# Patient Record
Sex: Female | Born: 2000 | Race: Black or African American | Hispanic: No | Marital: Single | Smoking: Never smoker
Health system: Southern US, Community
[De-identification: ages and names within clinical notes are randomized; demographics above are authoritative.]

## PROBLEM LIST (undated history)

## (undated) DIAGNOSIS — E669 Obesity, unspecified: Secondary | ICD-10-CM

## (undated) DIAGNOSIS — IMO0001 Reserved for inherently not codable concepts without codable children: Secondary | ICD-10-CM

## (undated) DIAGNOSIS — Z87442 Personal history of urinary calculi: Secondary | ICD-10-CM

## (undated) DIAGNOSIS — M79671 Pain in right foot: Secondary | ICD-10-CM

## (undated) DIAGNOSIS — F419 Anxiety disorder, unspecified: Secondary | ICD-10-CM

## (undated) DIAGNOSIS — J45909 Unspecified asthma, uncomplicated: Secondary | ICD-10-CM

## (undated) DIAGNOSIS — F32A Depression, unspecified: Secondary | ICD-10-CM

## (undated) DIAGNOSIS — J309 Allergic rhinitis, unspecified: Secondary | ICD-10-CM

## (undated) DIAGNOSIS — R030 Elevated blood-pressure reading, without diagnosis of hypertension: Secondary | ICD-10-CM

## (undated) HISTORY — PX: WISDOM TOOTH EXTRACTION: SHX21

## (undated) HISTORY — DX: Obesity, unspecified: E66.9

## (undated) HISTORY — DX: Reserved for inherently not codable concepts without codable children: IMO0001

## (undated) HISTORY — DX: Allergic rhinitis, unspecified: J30.9

## (undated) HISTORY — DX: Elevated blood-pressure reading, without diagnosis of hypertension: R03.0

---
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
## 2022-02-02 ENCOUNTER — Ambulatory Visit
Admission: EM | Admit: 2022-02-02 | Discharge: 2022-02-02 | Disposition: A | Discharge status: Home / Self Care | Payer: Medicaid Other | Attending: Physician Assistant | Admitting: Physician Assistant

## 2022-02-02 DIAGNOSIS — R6889 Other general symptoms and signs: Secondary | ICD-10-CM

## 2022-02-02 DIAGNOSIS — Z1152 Encounter for screening for COVID-19: Secondary | ICD-10-CM

## 2022-02-02 DIAGNOSIS — J069 Acute upper respiratory infection, unspecified: Secondary | ICD-10-CM

## 2022-02-02 LAB — POCT INFLUENZA A/B
Influenza A, POC: NEGATIVE
Influenza B, POC: NEGATIVE

## 2022-02-02 MED ORDER — OSELTAMIVIR PHOSPHATE 75 MG PO CAPS: 75.0000 mg | ORAL_CAPSULE | Freq: Two times a day (BID) | ORAL | 0 refills | Status: DC

## 2022-02-02 NOTE — ED Triage Notes (Signed)
Pt presents to uc with co of hoarseness, cough,body aches and pains, and sinus pain for 2-3 days. Pt reports no otc medications

## 2022-02-02 NOTE — ED Provider Notes (Signed)
[facility]    CSN: [id number] Arrival date & time: 02/02/22  1315      History   Chief Complaint Chief Complaint  Patient presents with   Hoarse   Cough   Facial Pain    HPI Brittany Mcmahon is a 22 y.o. female.   Patient here today for evaluation of hoarseness, cough, body aches and sinus congestion that she has had for the last 2 to 3 days.  She reports that she has not taken any over-the-counter medication for symptoms.  She reports that as time progresses her symptoms worsen.  She has not had any vomiting or diarrhea.  The history is provided by the patient.  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 Authorizing Provider  oseltamivir (TAMIFLU) 75 MG capsule Take 1 capsule (75 mg total) by mouth every 12 (twelve) hours. 02/02/22  Yes Tomi Bamberger, PA-C  albuterol (VENTOLIN HFA) 108 (90 Base) MCG/ACT inhaler Inhale 1-2 puffs into the lungs every 6 (six) hours as needed for wheezing or shortness of  breath. Patient not taking: Reported on 11/05/2021 04/06/21   [name], MD  metroNIDAZOLE (FLAGYL) 500 MG tablet Take 1 tablet (500 mg total) by mouth 2 (two) times daily. Patient not taking: Reported on 11/05/2021 09/14/21   Corlis Hove, NP  mupirocin ointment (BACTROBAN) 2 % Apply topically 2 (two) times daily. Patient not taking: Reported on 11/05/2021 08/06/21   [name], MD  ondansetron (ZOFRAN-ODT) 4 MG disintegrating tablet Take 1 tablet (4 mg total) by mouth every 8 (eight) hours as needed for nausea or vomiting. 11/15/21   Tomi Bamberger, PA-C  Vitamin D, Ergocalciferol, (DRISDOL) 1.25 MG (50000 UNIT) CAPS capsule Take 1 capsule (50,000 Units total) by mouth every 7 (seven) days. 04/29/21   Georges Mouse, NP    Family History Family History  Problem Relation Age of Onset   Hypertension Mother    Obesity Mother    Hypertension Father    Hypertension Maternal Grandmother    Asthma Maternal Uncle     Social History Social History   Tobacco Use   Smoking status: Never    Passive exposure: Yes   Smokeless tobacco: Never  Vaping Use   Vaping Use: Never used  Substance Use Topics   Alcohol use: No   Drug use: No     Allergies   Patient has no known allergies.   Review of Systems Review of Systems  Constitutional:  Negative for chills and fever.  HENT:  Positive for congestion and sinus pressure. Negative for ear pain and sore throat.   Eyes:  Negative for discharge and redness.  Respiratory:  Positive for cough. Negative for shortness of breath and wheezing.   Gastrointestinal:  Negative for abdominal pain, diarrhea, nausea and vomiting.  Musculoskeletal:  Positive for myalgias.     Physical Exam Triage Vital Signs ED Triage Vitals  Enc Vitals Group     BP 02/02/22 1524 131/79     Pulse Rate 02/02/22 1524 (!) 120     Resp 02/02/22 1524 19     Temp 02/02/22 1524 99 F (37.2 C)     Temp src --      SpO2 02/02/22 1524 98 %     Weight --       Height --      Head Circumference --      Peak Flow --      Pain Score 02/02/22 1523 4     Pain Loc --      Pain Edu? --      Excl. in [place]? --    No data found.  Updated Vital Signs BP 131/79   Pulse (!) 120   Temp 99 F (37.2 C)   Resp 19   LMP 12/31/2021 (Approximate)   SpO2 98%      Physical Exam Vitals and nursing note reviewed.  Constitutional:      General: She is in acute distress (appears to not feel well).     Appearance: She is not ill-appearing.  HENT:     Head: Normocephalic and atraumatic.     Nose: Congestion present.     Mouth/Throat:     Mouth: Mucous membranes are moist.     Pharynx: Posterior oropharyngeal erythema present. No oropharyngeal exudate.  Eyes:     Conjunctiva/sclera: Conjunctivae normal.  Cardiovascular:     Rate and Rhythm: Normal rate and regular rhythm.     Heart sounds: Normal heart sounds. No murmur heard. Pulmonary:     Effort: Pulmonary effort is normal. No respiratory distress.     Breath sounds: Normal breath sounds. No wheezing, rhonchi or rales.  Skin:    General: Skin is warm and dry.  Neurological:     Mental Status: She is alert.  Psychiatric:        Mood and Affect: Mood normal.        Thought Content: Thought content normal.      UC Treatments / Results  Labs (all labs ordered are listed, but only abnormal results are displayed) Labs Reviewed  SARS CORONAVIRUS 2 (TAT 6-24 HRS)  POCT INFLUENZA A/B    EKG   Radiology No results found.  Procedures Procedures (including critical care time)  Medications Ordered in UC Medications - No data to display  Initial Impression / Assessment and Plan / UC Course  I have reviewed the triage vital signs and the nursing notes.  Pertinent labs & imaging results that were available during my care of the patient were reviewed by me and considered in my medical decision making (see chart for details).    Suspect viral etiology of symptoms.  Flu test negative in office  however  given flulike symptoms offered Tamiflu for treatment which patient would like to proceed with.  COVID screening also ordered.  Recommended symptomatic treatment otherwise, increase fluids and rest with follow-up if symptoms fail to improve or worsen.  Final Clinical Impressions(s) / UC Diagnoses   Final diagnoses:  Acute upper respiratory infection  Encounter for screening for COVID-19  Flu-like symptoms   Discharge Instructions   None    ED Prescriptions     Medication Sig Dispense Auth. Provider   oseltamivir (TAMIFLU) 75 MG capsule Take 1 capsule (75 mg total) by mouth every 12 (twelve) hours. 10 capsule Francene Finders, PA-C      PDMP not reviewed this encounter.   Francene Finders, PA-C 02/02/22 [phone]

## 2022-02-03 LAB — SARS CORONAVIRUS 2 (TAT 6-24 HRS): SARS Coronavirus 2: NEGATIVE

## 2022-02-10 ENCOUNTER — Ambulatory Visit (INDEPENDENT_AMBULATORY_CARE_PROVIDER_SITE_OTHER): Payer: Medicaid Other | Admitting: Pediatrics

## 2022-02-10 ENCOUNTER — Other Ambulatory Visit (HOSPITAL_COMMUNITY)
Admission: RE | Admit: 2022-02-10 | Discharge: 2022-02-10 | Disposition: A | Discharge status: Home / Self Care | Payer: Medicaid Other | Source: Ambulatory Visit | Attending: Pediatrics | Admitting: Pediatrics

## 2022-02-10 ENCOUNTER — Encounter: Payer: Self-pay | Admitting: Pediatrics

## 2022-02-10 VITALS — Wt 268.8 lb

## 2022-02-10 DIAGNOSIS — Z3202 Encounter for pregnancy test, result negative: Secondary | ICD-10-CM

## 2022-02-10 DIAGNOSIS — J302 Other seasonal allergic rhinitis: Secondary | ICD-10-CM | POA: Diagnosis not present

## 2022-02-10 DIAGNOSIS — Z113 Encounter for screening for infections with a predominantly sexual mode of transmission: Secondary | ICD-10-CM | POA: Diagnosis present

## 2022-02-10 LAB — POCT URINE PREGNANCY: Preg Test, Ur: NEGATIVE

## 2022-02-10 MED ORDER — FLUTICASONE PROPIONATE 50 MCG/ACT NA SUSP: 1.0000 | Freq: Every day | NASAL | 3 refills | Status: DC

## 2022-02-10 NOTE — Progress Notes (Signed)
    Subjective:    Brittany Mcmahon is a 22 y.o. female  presenting to the clinic today for recheck of STIs. She was treated for gonorrhea 3 months back with ceftriaxone & also treated for BV with metronidazole. Pt is with a new partner for the past month & has been using condoms regularly. She has an IUD in place but wanted urine pregnancy test as she was sick with flu symptoms & ws worried about her nausea. No dysuria, no dyspareunia. Seen in the ED last week & treated with Tamiflu for flu like symptoms. Still has nasal congestion & would like refill of Flonase.  Review of Systems  Constitutional:  Negative for activity change, appetite change and fever.  Respiratory:  Negative for cough, shortness of breath and wheezing.   Gastrointestinal:  Negative for abdominal pain, diarrhea, nausea and vomiting.  Genitourinary:  Negative for dyspareunia, dysuria and vaginal discharge.  Skin:  Negative for rash.  Neurological:  Negative for headaches.  Psychiatric/Behavioral:  Negative for sleep disturbance.        Objective:   Physical Exam Vitals and nursing note reviewed.  Constitutional:      General: She is not in acute distress. HENT:     Head: Normocephalic and atraumatic.     Right Ear: External ear normal.     Left Ear: External ear normal.     Nose:     Comments: Boggy turbinates Eyes:     General:        Right eye: No discharge.        Left eye: No discharge.     Conjunctiva/sclera: Conjunctivae normal.  Cardiovascular:     Rate and Rhythm: Normal rate and regular rhythm.     Heart sounds: Normal heart sounds.  Pulmonary:     Effort: No respiratory distress.     Breath sounds: No wheezing or rales.  Musculoskeletal:     Cervical back: Normal range of motion.  Skin:    General: Skin is warm and dry.     Findings: No rash.    .Wt 268 lb 12.8 oz (121.9 kg)   LMP 12/31/2021 (Approximate)   BMI 46.17 kg/m       Assessment & Plan:  1. Seasonal allergies Turbinate  hypertrophy Refilled Flonase Use albuterol as needed  2. Screening examination for venereal disease Will send MyChart message with lab results - POCT urine pregnancy- negative - Urine cytology ancillary only - WET PREP BY MOLECULAR PROBE    Return if symptoms worsen or fail to improve.  Claudean Kinds, MD 02/10/2022 3:10 PM

## 2022-02-10 NOTE — Patient Instructions (Addendum)
Please check your MyChart message for STI screenings. Please continue to use condoms regularly to prevent STIs.  Please use albuterol as needed for cough/wheezing & use Flonase as needed for your nasal turbinate hypertrophy.

## 2022-02-11 LAB — WET PREP BY MOLECULAR PROBE
Candida species: NOT DETECTED
Gardnerella vaginalis: NOT DETECTED
MICRO NUMBER:: 14413222
SPECIMEN QUALITY:: ADEQUATE
Trichomonas vaginosis: NOT DETECTED

## 2022-02-11 LAB — URINE CYTOLOGY ANCILLARY ONLY
Chlamydia: NEGATIVE
Comment: NEGATIVE
Comment: NORMAL
Neisseria Gonorrhea: NEGATIVE

## 2022-10-02 ENCOUNTER — Emergency Department (HOSPITAL_COMMUNITY): Payer: Medicaid Other

## 2022-10-02 ENCOUNTER — Other Ambulatory Visit: Payer: Self-pay

## 2022-10-02 ENCOUNTER — Emergency Department (HOSPITAL_COMMUNITY)
Admission: EM | Admit: 2022-10-02 | Discharge: 2022-10-02 | Disposition: A | Discharge status: Home / Self Care | Payer: Medicaid Other | Source: Home / Self Care | Attending: Emergency Medicine | Admitting: Emergency Medicine

## 2022-10-02 ENCOUNTER — Encounter (HOSPITAL_COMMUNITY): Payer: Self-pay | Admitting: *Deleted

## 2022-10-02 DIAGNOSIS — T8332XA Displacement of intrauterine contraceptive device, initial encounter: Secondary | ICD-10-CM | POA: Insufficient documentation

## 2022-10-02 DIAGNOSIS — E876 Hypokalemia: Secondary | ICD-10-CM | POA: Insufficient documentation

## 2022-10-02 DIAGNOSIS — D72829 Elevated white blood cell count, unspecified: Secondary | ICD-10-CM | POA: Insufficient documentation

## 2022-10-02 DIAGNOSIS — N3000 Acute cystitis without hematuria: Secondary | ICD-10-CM | POA: Insufficient documentation

## 2022-10-02 DIAGNOSIS — N200 Calculus of kidney: Secondary | ICD-10-CM | POA: Insufficient documentation

## 2022-10-02 DIAGNOSIS — R1030 Lower abdominal pain, unspecified: Secondary | ICD-10-CM | POA: Diagnosis present

## 2022-10-02 LAB — URINALYSIS, ROUTINE W REFLEX MICROSCOPIC
Bilirubin Urine: NEGATIVE
Glucose, UA: NEGATIVE mg/dL
Ketones, ur: NEGATIVE mg/dL
Nitrite: NEGATIVE
Protein, ur: 30 mg/dL — AB
Specific Gravity, Urine: 1.013 (ref 1.005–1.030)
pH: 6 (ref 5.0–8.0)

## 2022-10-02 LAB — COMPREHENSIVE METABOLIC PANEL
ALT: 20 U/L (ref 0–44)
AST: 22 U/L (ref 15–41)
Albumin: 3.7 g/dL (ref 3.5–5.0)
Alkaline Phosphatase: 66 U/L (ref 38–126)
Anion gap: 13 (ref 5–15)
BUN: 9 mg/dL (ref 6–20)
CO2: 26 mmol/L (ref 22–32)
Calcium: 9.3 mg/dL (ref 8.9–10.3)
Chloride: 98 mmol/L (ref 98–111)
Creatinine, Ser: 0.69 mg/dL (ref 0.44–1.00)
GFR, Estimated: >60 mL/min (ref >60)
Glucose, Bld: 87 mg/dL (ref 70–99)
Potassium: 3.4 mmol/L — ABNORMAL LOW (ref 3.5–5.1)
Sodium: 137 mmol/L (ref 135–145)
Total Bilirubin: 0.3 mg/dL (ref 0.3–1.2)
Total Protein: 8.3 g/dL — ABNORMAL HIGH (ref 6.5–8.1)

## 2022-10-02 LAB — CBC
HCT: 36.6 % (ref 36.0–46.0)
Hemoglobin: 11.7 g/dL — ABNORMAL LOW (ref 12.0–15.0)
MCH: 27.1 pg (ref 26.0–34.0)
MCHC: 32 g/dL (ref 30.0–36.0)
MCV: 84.7 fL (ref 80.0–100.0)
Platelets: 426 10*3/uL — ABNORMAL HIGH (ref 150–400)
RBC: 4.32 MIL/uL (ref 3.87–5.11)
RDW: 13.3 % (ref 11.5–15.5)
WBC: 14.7 10*3/uL — ABNORMAL HIGH (ref 4.0–10.5)
nRBC: 0 % (ref 0.0–0.2)

## 2022-10-02 LAB — HCG, SERUM, QUALITATIVE: Preg, Serum: NEGATIVE

## 2022-10-02 LAB — LIPASE, BLOOD: Lipase: 25 U/L (ref 11–51)

## 2022-10-02 MED ORDER — IOHEXOL 350 MG/ML SOLN
75.0000 mL | Freq: Once | INTRAVENOUS | Status: AC | PRN
  Administered 2022-10-02: 75 mL via INTRAVENOUS

## 2022-10-02 MED ORDER — SODIUM CHLORIDE 0.9 % IV SOLN
1.0000 g | Freq: Once | INTRAVENOUS | Status: AC
  Administered 2022-10-02: 1 g via INTRAVENOUS
  Filled 2022-10-02: qty 10

## 2022-10-02 MED ORDER — DOCUSATE SODIUM 100 MG PO CAPS: 100.0000 mg | ORAL_CAPSULE | Freq: Two times a day (BID) | ORAL | 0 refills | Status: DC

## 2022-10-02 MED ORDER — ONDANSETRON HCL 4 MG/2ML IJ SOLN
4.0000 mg | Freq: Once | INTRAMUSCULAR | Status: AC
  Administered 2022-10-02: 4 mg via INTRAVENOUS
  Filled 2022-10-02: qty 2

## 2022-10-02 MED ORDER — CEPHALEXIN 500 MG PO CAPS: 500.0000 mg | ORAL_CAPSULE | Freq: Two times a day (BID) | ORAL | 0 refills | Status: DC

## 2022-10-02 MED ORDER — MORPHINE SULFATE (PF) 4 MG/ML IV SOLN
4.0000 mg | Freq: Once | INTRAVENOUS | Status: AC
  Administered 2022-10-02: 4 mg via INTRAVENOUS
  Filled 2022-10-02: qty 1

## 2022-10-02 NOTE — ED Provider Notes (Signed)
[facility] Emergency Department Provider Note MRN:  [id number]  Arrival date & time: 10/02/22     Chief Complaint   Abdominal Pain   History of Present Illness   Brittany Mcmahon is a 22 y.o. year-old female presents to the ED with chief complaint of lower abdominal pain for the past 3 days.  She reports associated nausea.  She states that she is just finishing her period.  She denies any dysuria or hematuria.  Denies abnormal urinary symptoms. She denies any fevers or chills. She states that the symptoms have been worsening.  History provided by patient.   Review of Systems  Pertinent positive and negative review of systems noted in HPI.    Physical Exam   Vitals:   10/02/22 0300 10/02/22 0445  BP: 125/83 (!) 142/96  Pulse: 92 89  Resp:  16  Temp:  98.7 F (37.1 C)  SpO2: 100% 100%    CONSTITUTIONAL:  non toxic-appearing, NAD NEURO:  Alert and oriented x 3, CN 3-12 grossly intact EYES:  eyes equal and reactive ENT/NECK:  Supple, no stridor  CARDIO:  tachycardic, regular rhythm, appears well-perfused  PULM:  No respiratory distress, CTAB GI/GU:  non-distended, RLQ TTP MSK/SPINE:  No gross deformities, no edema, moves all extremities  SKIN:  no rash, atraumatic   *Additional and/or pertinent findings included in MDM below  Diagnostic and Interventional Summary    EKG Interpretation Date/Time:    Ventricular Rate:    PR Interval:    QRS Duration:    QT Interval:    QTC Calculation:   R Axis:      Text Interpretation:         Labs Reviewed  COMPREHENSIVE METABOLIC PANEL - Abnormal; Notable for the following components:      Result Value   Potassium 3.4 (*)    Total Protein 8.3 (*)    All other components within normal limits  CBC - Abnormal; Notable for the following components:   WBC 14.7 (*)    Hemoglobin 11.7 (*)    Platelets 426 (*)    All other components within normal limits  URINALYSIS, ROUTINE W REFLEX MICROSCOPIC -  Abnormal; Notable for the following components:   APPearance HAZY (*)    Hgb urine dipstick MODERATE (*)    Protein, ur 30 (*)    Leukocytes,Ua LARGE (*)    Bacteria, UA FEW (*)    All other components within normal limits  LIPASE, BLOOD  HCG, SERUM, QUALITATIVE    CT ABDOMEN PELVIS W CONTRAST  Final Result      Medications  morphine (PF) 4 MG/ML injection 4 mg (4 mg Intravenous Given 10/02/22 0332)  ondansetron (ZOFRAN) injection 4 mg (4 mg Intravenous Given 10/02/22 0331)  iohexol (OMNIPAQUE) 350 MG/ML injection 75 mL (75 mLs Intravenous Contrast Given 10/02/22 0356)  cefTRIAXone (ROCEPHIN) 1 g in sodium chloride 0.9 % 100 mL IVPB (0 g Intravenous Stopped 10/02/22 7829)     Procedures  /  Critical Care Procedures  ED Course and Medical Decision Making  I have reviewed the triage vital signs, the nursing notes, and pertinent available records from the EMR.  Social Determinants Affecting Complexity of Care: Patient has no clinically significant social determinants affecting this chief complaint..   ED Course:    Medical Decision Making Patient here with lower abdominal pain for the past 3 days.  She has had some nausea, but denies recent vomiting.    She denies any fevers.  UA  looks concerning for UTI. She does have some RLQ TTP on exam.  I'll check a CT to rule out appy.    Amount and/or Complexity of Data Reviewed Labs: ordered.    Details: UA worrisome for infection, will treat with keflex Leukocytosis to 14.7, thought 2/2 UTI Preg test is negative Mild hypokalemia of 3.4 Radiology: ordered.    Details: No evidence of appy on CT, questionable malpositioned IUD, I discussed this with the patient.  She will follow-up with OBGYN.    Questionable renal stone, but not thought to be the cause of her pain  Risk OTC drugs. Prescription drug management.         Consultants: No consultations were needed in caring for this patient.   Treatment and Plan: I  considered admission due to patient's initial presentation, but after considering the examination and diagnostic results, patient will not require admission and can be discharged with outpatient follow-up.    Final Clinical Impressions(s) / ED Diagnoses     ICD-10-CM   1. Acute cystitis without hematuria  N30.00     2. Malpositioned intrauterine device (IUD), initial encounter  T83.32XA     3. Renal stone  N20.0       ED Discharge Orders          Ordered    cephALEXin (KEFLEX) 500 MG capsule  2 times daily        10/02/22 0517    docusate sodium (COLACE) 100 MG capsule  Every 12 hours        10/02/22 0517              Discharge Instructions Discussed with and Provided to Patient:     Discharge Instructions      Your symptoms and tests are consistent with bladder infection.  Take antibiotics as directed.  You might have some constipation as well.    If symptoms change or worsen, return to the ER.       Roxy Horseman, PA-C 10/02/22 0530    Palumbo, April, MD 10/02/22 [phone]

## 2022-10-02 NOTE — ED Notes (Signed)
Patient transported to CT 

## 2022-10-02 NOTE — Discharge Instructions (Addendum)
Your symptoms and tests are consistent with bladder infection.  Take antibiotics as directed.  You might have some constipation as well.    If symptoms change or worsen, return to the ER.

## 2022-10-02 NOTE — ED Triage Notes (Signed)
The pt is c/o abd pain for 3 days with nausea lmp last week

## 2022-10-04 ENCOUNTER — Encounter (HOSPITAL_COMMUNITY): Payer: Self-pay

## 2022-10-04 ENCOUNTER — Emergency Department (HOSPITAL_COMMUNITY)
Admission: EM | Admit: 2022-10-04 | Discharge: 2022-10-05 | Disposition: A | Discharge status: Home / Self Care | Payer: Medicaid Other | Attending: Emergency Medicine | Admitting: Emergency Medicine

## 2022-10-04 ENCOUNTER — Other Ambulatory Visit: Payer: Self-pay

## 2022-10-04 DIAGNOSIS — R102 Pelvic and perineal pain: Secondary | ICD-10-CM | POA: Diagnosis present

## 2022-10-04 LAB — URINALYSIS, ROUTINE W REFLEX MICROSCOPIC
Bacteria, UA: NONE SEEN
Bilirubin Urine: NEGATIVE
Glucose, UA: NEGATIVE mg/dL
Ketones, ur: NEGATIVE mg/dL
Leukocytes,Ua: NEGATIVE
Nitrite: NEGATIVE
Protein, ur: NEGATIVE mg/dL
Specific Gravity, Urine: 1.013 (ref 1.005–1.030)
pH: 6 (ref 5.0–8.0)

## 2022-10-04 LAB — CBC WITH DIFFERENTIAL/PLATELET
Abs Immature Granulocytes: 0.06 10*3/uL (ref 0.00–0.07)
Basophils Absolute: 0.1 10*3/uL (ref 0.0–0.1)
Basophils Relative: 1 %
Eosinophils Absolute: 0.7 10*3/uL — ABNORMAL HIGH (ref 0.0–0.5)
Eosinophils Relative: 6 %
HCT: 39 % (ref 36.0–46.0)
Hemoglobin: 12.3 g/dL (ref 12.0–15.0)
Immature Granulocytes: 1 %
Lymphocytes Relative: 37 %
Lymphs Abs: 4.1 10*3/uL — ABNORMAL HIGH (ref 0.7–4.0)
MCH: 27.2 pg (ref 26.0–34.0)
MCHC: 31.5 g/dL (ref 30.0–36.0)
MCV: 86.1 fL (ref 80.0–100.0)
Monocytes Absolute: 0.5 10*3/uL (ref 0.1–1.0)
Monocytes Relative: 5 %
Neutro Abs: 5.6 10*3/uL (ref 1.7–7.7)
Neutrophils Relative %: 50 %
Platelets: 451 10*3/uL — ABNORMAL HIGH (ref 150–400)
RBC: 4.53 MIL/uL (ref 3.87–5.11)
RDW: 13.5 % (ref 11.5–15.5)
WBC: 11 10*3/uL — ABNORMAL HIGH (ref 4.0–10.5)
nRBC: 0 % (ref 0.0–0.2)

## 2022-10-04 LAB — COMPREHENSIVE METABOLIC PANEL
ALT: 19 U/L (ref 0–44)
AST: 19 U/L (ref 15–41)
Albumin: 3.4 g/dL — ABNORMAL LOW (ref 3.5–5.0)
Alkaline Phosphatase: 62 U/L (ref 38–126)
Anion gap: 12 (ref 5–15)
BUN: 8 mg/dL (ref 6–20)
CO2: 27 mmol/L (ref 22–32)
Calcium: 9.4 mg/dL (ref 8.9–10.3)
Chloride: 99 mmol/L (ref 98–111)
Creatinine, Ser: 0.64 mg/dL (ref 0.44–1.00)
GFR, Estimated: >60 mL/min (ref >60)
Glucose, Bld: 95 mg/dL (ref 70–99)
Potassium: 3.5 mmol/L (ref 3.5–5.1)
Sodium: 138 mmol/L (ref 135–145)
Total Bilirubin: 0.5 mg/dL (ref 0.3–1.2)
Total Protein: 8 g/dL (ref 6.5–8.1)

## 2022-10-04 LAB — HCG, SERUM, QUALITATIVE: Preg, Serum: NEGATIVE

## 2022-10-04 NOTE — ED Triage Notes (Signed)
Pt arrives with c/o left sided flank pain that started today. Pt recently diagnosed with a UTI and kidney stones. Per pt, she had an episode of n/v today while at work. Pt worried antibiotics are not working. Pt denies fevers.

## 2022-10-05 ENCOUNTER — Emergency Department (HOSPITAL_COMMUNITY): Payer: Medicaid Other

## 2022-10-05 MED ORDER — ONDANSETRON HCL 4 MG PO TABS: 4.0000 mg | ORAL_TABLET | Freq: Three times a day (TID) | ORAL | 0 refills | Status: DC | PRN

## 2022-10-05 NOTE — ED Provider Notes (Signed)
[place] [facility] Provider Note   CSN: [id number] Arrival date & time: 10/04/22  2204     History  Chief Complaint  Patient presents with   Flank Pain    Brittany Mcmahon is a 22 y.o. female.  Patient with past medical history significant for IUD, obesity presents to the emergency department complaining of pelvic pain.  Patient was evaluated last week at the emergency department for similar pains and a CT abdomen pelvis was performed which showed evidence that her IUD may have the left bar penetrating the myometrium with no associated inflammation or free fluid.  There was also questionable right lower pole nephrolithiasis with no convincing obstructive uropathy or renal inflammation.  UA suggested UTI and the patient was started on Keflex.  She states she is currently taken 3 days of Keflex.  Today she took her Keflex dose at approximately 11 AM and upon arriving at work at approximately 4 PM began to feel nauseated and vomited twice.  She called the nurse line at her OB/GYN who recommended she come to the emergency department for evaluation.  She denies chest pain, shortness of breath, urinary symptoms.  Endorses intermittent pelvic pain, nausea, vomiting.   Flank Pain       Home Medications Prior to Admission medications   Medication Sig Start Date End Date Taking? Authorizing Provider  ondansetron (ZOFRAN) 4 MG tablet Take 1 tablet (4 mg total) by mouth every 8 (eight) hours as needed for nausea or vomiting. 10/05/22  Yes Darrick Grinder, PA-C  albuterol (VENTOLIN HFA) 108 (90 Base) MCG/ACT inhaler Inhale 1-2 puffs into the lungs every 6 (six) hours as needed for wheezing or shortness of breath. Patient not taking: Reported on 11/05/2021 04/06/21   [name], MD  cephALEXin (KEFLEX) 500 MG capsule Take 1 capsule (500 mg total) by mouth 2 (two) times daily. 10/02/22   Roxy Horseman, PA-C  docusate sodium (COLACE) 100 MG capsule Take 1 capsule  (100 mg total) by mouth every 12 (twelve) hours. 10/02/22   Roxy Horseman, PA-C  fluticasone (FLONASE) 50 MCG/ACT nasal spray Place 1 spray into both nostrils daily. 02/10/22   [name], MD  oseltamivir (TAMIFLU) 75 MG capsule Take 1 capsule (75 mg total) by mouth every 12 (twelve) hours. 02/02/22   Tomi Bamberger, PA-C  Vitamin D, Ergocalciferol, (DRISDOL) 1.25 MG (50000 UNIT) CAPS capsule Take 1 capsule (50,000 Units total) by mouth every 7 (seven) days. Patient not taking: Reported on 02/10/2022 04/29/21   Georges Mouse, NP      Allergies    Patient has no known allergies.    Review of Systems   Review of Systems  Genitourinary:  Positive for flank pain.    Physical Exam Updated Vital Signs BP (!) 173/109 (BP Location: Right Arm)   Pulse (!) 105   Temp 98.9 F (37.2 C) (Oral)   Resp (!) 22   Wt 121.6 kg   LMP 09/25/2022   SpO2 100%   BMI 47.47 kg/m  Physical Exam Vitals and nursing note reviewed.  Constitutional:      General: She is not in acute distress.    Appearance: She is well-developed.  HENT:     Head: Normocephalic and atraumatic.  Eyes:     Conjunctiva/sclera: Conjunctivae normal.  Cardiovascular:     Rate and Rhythm: Normal rate and regular rhythm.     Heart sounds: No murmur heard. Pulmonary:     Effort: Pulmonary  effort is normal. No respiratory distress.     Breath sounds: Normal breath sounds.  Abdominal:     Palpations: Abdomen is soft.     Tenderness: There is abdominal tenderness (Mild tenderness to palpation of the suprapubic area).  Genitourinary:    Comments: Deferred Musculoskeletal:        General: No swelling.     Cervical back: Neck supple.  Skin:    General: Skin is warm and dry.     Capillary Refill: Capillary refill takes less than 2 seconds.  Neurological:     Mental Status: She is alert.  Psychiatric:        Mood and Affect: Mood normal.     ED Results / Procedures / Treatments   Labs (all labs ordered are listed,  but only abnormal results are displayed) Labs Reviewed  CBC WITH DIFFERENTIAL/PLATELET - Abnormal; Notable for the following components:      Result Value   WBC 11.0 (*)    Platelets 451 (*)    Lymphs Abs 4.1 (*)    Eosinophils Absolute 0.7 (*)    All other components within normal limits  COMPREHENSIVE METABOLIC PANEL - Abnormal; Notable for the following components:   Albumin 3.4 (*)    All other components within normal limits  URINALYSIS, ROUTINE W REFLEX MICROSCOPIC - Abnormal; Notable for the following components:   Color, Urine STRAW (*)    Hgb urine dipstick SMALL (*)    All other components within normal limits  HCG, SERUM, QUALITATIVE    EKG None  Radiology US PELVIC COMPLETE W TRANSVAGINAL AND TORSION R/O  Result Date: 10/05/2022 CLINICAL DATA:  Pelvic pain, left flank pain EXAM: TRANSABDOMINAL AND TRANSVAGINAL ULTRASOUND OF PELVIS DOPPLER ULTRASOUND OF OVARIES TECHNIQUE: Both transabdominal and transvaginal ultrasound examinations of the pelvis were performed. Transabdominal technique was performed for global imaging of the pelvis including uterus, ovaries, adnexal regions, and pelvic cul-de-sac. It was necessary to proceed with endovaginal exam following the transabdominal exam to visualize the uterus, endometrium, ovaries and adnexa. Color and duplex Doppler ultrasound was utilized to evaluate blood flow to the ovaries. COMPARISON:  CT 10/02/2022 FINDINGS: Uterus Measurements: 5.0 x 2.7 x 3.7 cm = volume: 26 mL. No fibroids or other mass visualized. Endometrium Thickness: Normal thickness, 2 mm. IUD in place. The lower uterine segment limb of the IUD appears to penetrate into the posterior myometrium of the lower uterine segment. No focal abnormality visualized. Right ovary Measurements: 1.6 x 1.4 x 1.6 cm = volume: 2 mL. Normal appearance/no adnexal mass. Left ovary Measurements: 3.1 x 1.4 x 3.0 cm = volume: 7 mL. Normal appearance/no adnexal mass. Pulsed Doppler evaluation of  both ovaries demonstrates normal low-resistance arterial and venous waveforms. Other findings Small amount of free fluid in the pelvis. IMPRESSION: No acute findings. IUD noted within the endometrium. The lower bar/limb of the IUD appears to penetrate into the myometrium posteriorly within the lower uterine segment. Electronically Signed   By: Charlett Nose M.D.   On: 10/05/2022 02:57    Procedures Procedures    Medications Ordered in ED Medications - No data to display  ED Course/ Medical Decision Making/ A&P                                 Medical Decision Making Amount and/or Complexity of Data Reviewed Labs: ordered. Radiology: ordered.   This patient presents to the ED for concern of pelvic  pain, nausea, vomiting, this involves an extensive number of treatment options, and is a complaint that carries with it a high risk of complications and morbidity.  The differential diagnosis includes pain due to IUD placement, all reaction to Keflex, TOA, ectopic pregnancy, PID, others   Co morbidities that complicate the patient evaluation  Obesity   Additional history obtained:   External records from outside source obtained and reviewed including CT results from last week   Lab Tests:  I Ordered, and personally interpreted labs.  The pertinent results include: UA with small hemoglobin but otherwise unremarkable with no bacteria, nitrite negative, leukocyte negative; WBC 11,000, improved from 14,700 last week; negative pregnancy test   Imaging Studies ordered:  I ordered imaging studies including pelvic ultrasound I independently visualized and interpreted imaging which showed  No acute findings.    IUD noted within the endometrium. The lower bar/limb of the IUD  appears to penetrate into the myometrium posteriorly within the  lower uterine segment.   I agree with the radiologist interpretation   Social Determinants of Health:  Patient has Medicaid for her primary health  insurance type   Test / Admission - Considered:  Reassuring pelvic ultrasound.  No significant acute findings on ultrasound, no significant abnormal findings on lab work today.  Question patient may be having a reaction to the Keflex the timing of the nausea does not directly correlate with the Keflex usage.  Plan to prescribe Zofran for nausea and have patient continue with plan to follow-up with GYN for further evaluation of her IUD placement.  I do not see indication today for further CT imaging as pain is very intermittent in nature and not easily reproducible on exam.  Plan to discharge home with return precautions.         Final Clinical Impression(s) / ED Diagnoses Final diagnoses:  Pelvic pain    Rx / DC Orders ED Discharge Orders          Ordered    ondansetron (ZOFRAN) 4 MG tablet  Every 8 hours PRN        10/05/22 0314              Darrick Grinder, PA-C 10/05/22 0317    Dione Booze, MD 10/05/22 [phone]

## 2022-10-05 NOTE — ED Notes (Signed)
Patient transported to Ultrasound 

## 2022-10-05 NOTE — Discharge Instructions (Signed)
You were evaluated today for pelvic pain, nausea, vomiting.  Your workup was reassuring.  Please follow-up with GYN to discuss the positioning of your IUD.  If you develop any life-threatening symptoms or is worsening abdominal pain or intractable nausea and vomiting, please return to the emergency department.

## 2023-01-14 ENCOUNTER — Encounter (HOSPITAL_COMMUNITY): Payer: Self-pay

## 2023-01-14 ENCOUNTER — Ambulatory Visit (HOSPITAL_COMMUNITY)
Admission: RE | Admit: 2023-01-14 | Discharge: 2023-01-14 | Disposition: A | Discharge status: Home / Self Care | Payer: Medicaid Other | Source: Ambulatory Visit | Attending: Family Medicine | Admitting: Family Medicine

## 2023-01-14 VITALS — BP 137/89 | HR 98 | Temp 98.9°F | Resp 16 | Ht 63.0 in | Wt 121.6 lb

## 2023-01-14 DIAGNOSIS — Z202 Contact with and (suspected) exposure to infections with a predominantly sexual mode of transmission: Secondary | ICD-10-CM | POA: Diagnosis present

## 2023-01-14 DIAGNOSIS — Z113 Encounter for screening for infections with a predominantly sexual mode of transmission: Secondary | ICD-10-CM | POA: Insufficient documentation

## 2023-01-14 DIAGNOSIS — H66001 Acute suppurative otitis media without spontaneous rupture of ear drum, right ear: Secondary | ICD-10-CM | POA: Insufficient documentation

## 2023-01-14 LAB — RPR: RPR Ser Ql: NONREACTIVE

## 2023-01-14 LAB — HIV ANTIBODY (ROUTINE TESTING W REFLEX): HIV Screen 4th Generation wRfx: NONREACTIVE

## 2023-01-14 MED ORDER — AMOXICILLIN 875 MG PO TABS: 875.0000 mg | ORAL_TABLET | Freq: Two times a day (BID) | ORAL | 0 refills | Status: AC

## 2023-01-14 NOTE — Discharge Instructions (Signed)
You were diagnosed with an ear infection today.  I have sent out an antibiotic to take twice/day x 10 days.  Your swab and blood work will be resulted tomorrow.  If anything is positive that needs to be treated you will be notified, or may call here as it is the weekend.

## 2023-01-14 NOTE — ED Provider Notes (Signed)
[facility]    CSN: [id number] Arrival date & time: 01/14/23  7829      History   Chief Complaint Chief Complaint  Patient presents with   Appointment   Otalgia   Exposure to STD    HPI Brittany Mcmahon is a 22 y.o. female.    Otalgia Exposure to STD  Patient is here for right ear pain/muffled sounds x 4 days.  No runny nose, congestion that is new for her.  No fevers/chills.   Possible exposure to chlamydia.  No vaginal symptoms noted.  No burning/pain/odor/discharge.        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 Authorizing Provider  albuterol (VENTOLIN HFA) 108 (90 Base) MCG/ACT inhaler Inhale 1-2 puffs into the lungs every 6 (six) hours as needed for wheezing or shortness of breath. Patient not taking: Reported on 11/05/2021 04/06/21   [name], MD  levonorgestrel (MIRENA) 20 MCG/DAY IUD 1 each by Intrauterine route once.    [provider]    Family History Family History  Problem Relation Age of Onset   Hypertension Mother    Obesity Mother    Hypertension Father    Hypertension Maternal Grandmother    Asthma Maternal Uncle     Social  History Social History   Tobacco Use   Smoking status: Never    Passive exposure: Yes   Smokeless tobacco: Never  Vaping Use   Vaping status: Never Used  Substance Use Topics   Alcohol use: No   Drug use: No     Allergies   Patient has no known allergies.   Review of Systems Review of Systems  Constitutional: Negative.   HENT:  Positive for ear pain.   Respiratory: Negative.    Cardiovascular: Negative.   Gastrointestinal: Negative.   Genitourinary: Negative.   Musculoskeletal: Negative.   Psychiatric/Behavioral: Negative.       Physical Exam Triage Vital Signs ED Triage Vitals  Encounter Vitals Group     BP 01/14/23 0949 137/89     Systolic BP Percentile --      Diastolic BP Percentile --      Pulse Rate 01/14/23 0949 98     Resp 01/14/23 0949 16     Temp 01/14/23 0949 98.9 F (37.2 C)     Temp Source 01/14/23 0949 Oral     SpO2 01/14/23 0949 95 %  Weight 01/14/23 0949 121 lb 9.6 oz (55.2 kg)     Height 01/14/23 0949 5\' 3"  (1.6 m)     Head Circumference --      Peak Flow --      Pain Score 01/14/23 0946 1     Pain Loc --      Pain Education --      Exclude from Growth Chart --    No data found.  Updated Vital Signs BP 137/89 (BP Location: Right Arm)   Pulse 98   Temp 98.9 F (37.2 C) (Oral)   Resp 16   Ht 5\' 3"  (1.6 m)   Wt 55.2 kg   SpO2 95%   BMI 21.54 kg/m   Visual Acuity Right Eye Distance:   Left Eye Distance:   Bilateral Distance:    Right Eye Near:   Left Eye Near:    Bilateral Near:     Physical Exam Constitutional:      Appearance: Normal appearance.  HENT:     Right Ear: A middle ear effusion is present. Tympanic membrane is erythematous and bulging.     Left Ear: Tympanic membrane normal.  Cardiovascular:     Rate and Rhythm: Normal rate and regular rhythm.  Pulmonary:     Effort: Pulmonary effort is normal.     Breath sounds: Normal breath sounds.  Musculoskeletal:     Cervical back: Normal range of motion and  neck supple.  Neurological:     General: No focal deficit present.     Mental Status: She is alert.  Psychiatric:        Mood and Affect: Mood normal.      UC Treatments / Results  Labs (all labs ordered are listed, but only abnormal results are displayed) Labs Reviewed  RPR  HIV ANTIBODY (ROUTINE TESTING W REFLEX)  CERVICOVAGINAL ANCILLARY ONLY    EKG   Radiology No results found.  Procedures Procedures (including critical care time)  Medications Ordered in UC Medications - No data to display  Initial Impression / Assessment and Plan / UC Course  I have reviewed the triage vital signs and the nursing notes.  Pertinent labs & imaging results that were available during my care of the patient were reviewed by me and considered in my medical decision making (see chart for details).   Final Clinical Impressions(s) / UC Diagnoses   Final diagnoses:  Possible exposure to STD  Non-recurrent acute suppurative otitis media of right ear without spontaneous rupture of tympanic membrane  Screening for STD (sexually transmitted disease)     Discharge Instructions      You were diagnosed with an ear infection today.  I have sent out an antibiotic to take twice/day x 10 days.  Your swab and blood work will be resulted tomorrow.  If anything is positive that needs to be treated you will be notified, or may call here as it is the weekend.     ED Prescriptions     Medication Sig Dispense Auth. Provider   amoxicillin (AMOXIL) 875 MG tablet Take 1 tablet (875 mg total) by mouth 2 (two) times daily for 10 days. 20 tablet Jannifer Franklin, MD      PDMP not reviewed this encounter.   Jannifer Franklin, MD 01/14/23 1003

## 2023-01-14 NOTE — ED Triage Notes (Addendum)
Right ear pain and muffled sound onset this past Tuesday night. Patient was listening to music on her way home, then when she got out the car she noticed sound on that side was muffled. No history of issues with the ear. No recent illness, no known sick exposure.   Patient also states she was informed by her partner that he tested positive for chlamydia. No current symptoms. Last interacted with this partner in November.

## 2023-01-17 ENCOUNTER — Telehealth (HOSPITAL_COMMUNITY): Payer: Self-pay

## 2023-01-17 LAB — CERVICOVAGINAL ANCILLARY ONLY
Bacterial Vaginitis (gardnerella): NEGATIVE
Candida Glabrata: NEGATIVE
Candida Vaginitis: POSITIVE — AB
Chlamydia: POSITIVE — AB
Comment: NEGATIVE
Comment: NEGATIVE
Comment: NEGATIVE
Comment: NEGATIVE
Comment: NEGATIVE
Comment: NORMAL
Neisseria Gonorrhea: NEGATIVE
Trichomonas: NEGATIVE

## 2023-01-17 MED ORDER — DOXYCYCLINE HYCLATE 100 MG PO CAPS: 100.0000 mg | ORAL_CAPSULE | Freq: Two times a day (BID) | ORAL | 0 refills | Status: DC

## 2023-01-17 MED ORDER — FLUCONAZOLE 150 MG PO TABS: 150.0000 mg | ORAL_TABLET | Freq: Once | ORAL | 0 refills | Status: AC

## 2023-01-17 NOTE — Telephone Encounter (Signed)
Per protocol, pt requires tx with Diflucan and Doxycycline. Attempted to reach patient x1. "Call could not be completed." Rx sent to pharmacy on file.

## 2023-06-23 ENCOUNTER — Other Ambulatory Visit: Payer: Self-pay

## 2023-06-23 ENCOUNTER — Encounter (HOSPITAL_COMMUNITY): Payer: Self-pay | Admitting: Emergency Medicine

## 2023-06-23 ENCOUNTER — Emergency Department (HOSPITAL_COMMUNITY)
Admission: EM | Admit: 2023-06-23 | Discharge: 2023-06-23 | Disposition: A | Discharge status: Home / Self Care | Attending: Emergency Medicine | Admitting: Emergency Medicine

## 2023-06-23 DIAGNOSIS — X102XXA Contact with fats and cooking oils, initial encounter: Secondary | ICD-10-CM | POA: Diagnosis not present

## 2023-06-23 DIAGNOSIS — T2020XA Burn of second degree of head, face, and neck, unspecified site, initial encounter: Secondary | ICD-10-CM | POA: Insufficient documentation

## 2023-06-23 DIAGNOSIS — J45909 Unspecified asthma, uncomplicated: Secondary | ICD-10-CM | POA: Diagnosis not present

## 2023-06-23 MED ORDER — NAPROXEN 500 MG PO TABS: 500.0000 mg | ORAL_TABLET | Freq: Two times a day (BID) | ORAL | 0 refills | Status: DC

## 2023-06-23 MED ORDER — KETOROLAC TROMETHAMINE 15 MG/ML IJ SOLN
15.0000 mg | Freq: Once | INTRAMUSCULAR | Status: AC
  Administered 2023-06-23: 15 mg via INTRAVENOUS
  Filled 2023-06-23: qty 1

## 2023-06-23 MED ORDER — MUPIROCIN CALCIUM 2 % EX CREA
TOPICAL_CREAM | Freq: Once | CUTANEOUS | Status: AC
  Filled 2023-06-23: qty 15
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
## 2023-08-30 ENCOUNTER — Encounter (HOSPITAL_COMMUNITY): Payer: Self-pay | Admitting: *Deleted

## 2023-08-30 ENCOUNTER — Emergency Department (HOSPITAL_COMMUNITY)
Admission: EM | Admit: 2023-08-30 | Discharge: 2023-08-30 | Disposition: A | Discharge status: Home / Self Care | Attending: Emergency Medicine | Admitting: Emergency Medicine

## 2023-08-30 ENCOUNTER — Other Ambulatory Visit: Payer: Self-pay

## 2023-08-30 ENCOUNTER — Emergency Department (HOSPITAL_COMMUNITY)

## 2023-08-30 DIAGNOSIS — N132 Hydronephrosis with renal and ureteral calculous obstruction: Secondary | ICD-10-CM | POA: Insufficient documentation

## 2023-08-30 DIAGNOSIS — R109 Unspecified abdominal pain: Secondary | ICD-10-CM | POA: Diagnosis present

## 2023-08-30 DIAGNOSIS — N23 Unspecified renal colic: Secondary | ICD-10-CM

## 2023-08-30 LAB — URINALYSIS, ROUTINE W REFLEX MICROSCOPIC
Bacteria, UA: NONE SEEN
Bilirubin Urine: NEGATIVE
Glucose, UA: NEGATIVE mg/dL
Ketones, ur: NEGATIVE mg/dL
Nitrite: NEGATIVE
Protein, ur: 30 mg/dL — AB
RBC / HPF: >50 RBC/hpf (ref 0–5)
Specific Gravity, Urine: 1.023 (ref 1.005–1.030)
pH: 7 (ref 5.0–8.0)

## 2023-08-30 LAB — COMPREHENSIVE METABOLIC PANEL WITH GFR
ALT: 12 U/L (ref 0–44)
AST: 20 U/L (ref 15–41)
Albumin: 3.8 g/dL (ref 3.5–5.0)
Alkaline Phosphatase: 74 U/L (ref 38–126)
Anion gap: 10 (ref 5–15)
BUN: 21 mg/dL — ABNORMAL HIGH (ref 6–20)
CO2: 25 mmol/L (ref 22–32)
Calcium: 9.7 mg/dL (ref 8.9–10.3)
Chloride: 105 mmol/L (ref 98–111)
Creatinine, Ser: 1.07 mg/dL — ABNORMAL HIGH (ref 0.44–1.00)
GFR, Estimated: >60 mL/min (ref >60)
Glucose, Bld: 99 mg/dL (ref 70–99)
Potassium: 3.9 mmol/L (ref 3.5–5.1)
Sodium: 140 mmol/L (ref 135–145)
Total Bilirubin: 0.4 mg/dL (ref 0.0–1.2)
Total Protein: 8.8 g/dL — ABNORMAL HIGH (ref 6.5–8.1)

## 2023-08-30 LAB — CBC WITH DIFFERENTIAL/PLATELET
Abs Immature Granulocytes: 0.13 K/uL — ABNORMAL HIGH (ref 0.00–0.07)
Basophils Absolute: 0.1 K/uL (ref 0.0–0.1)
Basophils Relative: 1 %
Eosinophils Absolute: 0.7 K/uL — ABNORMAL HIGH (ref 0.0–0.5)
Eosinophils Relative: 4 %
HCT: 44.9 % (ref 36.0–46.0)
Hemoglobin: 14 g/dL (ref 12.0–15.0)
Immature Granulocytes: 1 %
Lymphocytes Relative: 20 %
Lymphs Abs: 3.9 K/uL (ref 0.7–4.0)
MCH: 27 pg (ref 26.0–34.0)
MCHC: 31.2 g/dL (ref 30.0–36.0)
MCV: 86.7 fL (ref 80.0–100.0)
Monocytes Absolute: 1.3 K/uL — ABNORMAL HIGH (ref 0.1–1.0)
Monocytes Relative: 7 %
Neutro Abs: 12.8 K/uL — ABNORMAL HIGH (ref 1.7–7.7)
Neutrophils Relative %: 67 %
Platelets: 483 K/uL — ABNORMAL HIGH (ref 150–400)
RBC: 5.18 MIL/uL — ABNORMAL HIGH (ref 3.87–5.11)
RDW: 14.1 % (ref 11.5–15.5)
WBC: 18.9 K/uL — ABNORMAL HIGH (ref 4.0–10.5)
nRBC: 0 % (ref 0.0–0.2)

## 2023-08-30 LAB — HCG, SERUM, QUALITATIVE: Preg, Serum: NEGATIVE

## 2023-08-30 LAB — LIPASE, BLOOD: Lipase: 36 U/L (ref 11–51)

## 2023-08-30 IMAGING — DX DG WRIST COMPLETE 3+V*R*
4 series · 4 of 4 positions shown · non-contrast
Comparison: None Available.

CLINICAL DATA: Right wrist injury.  Pain along the ulnar aspect.

EXAM:
RIGHT WRIST - COMPLETE 3+ VIEW

[wrist pa (1 of 2)]
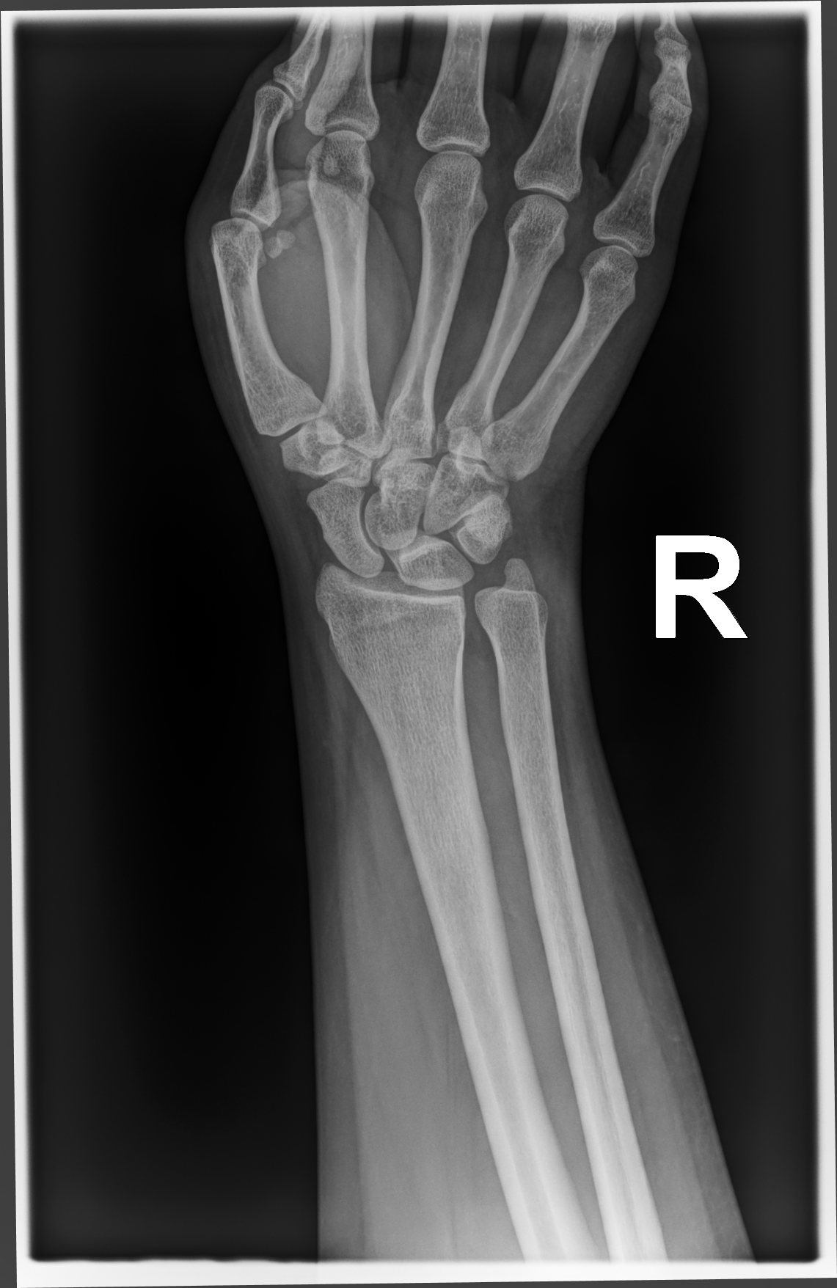

[wrist pa (2 of 2)]
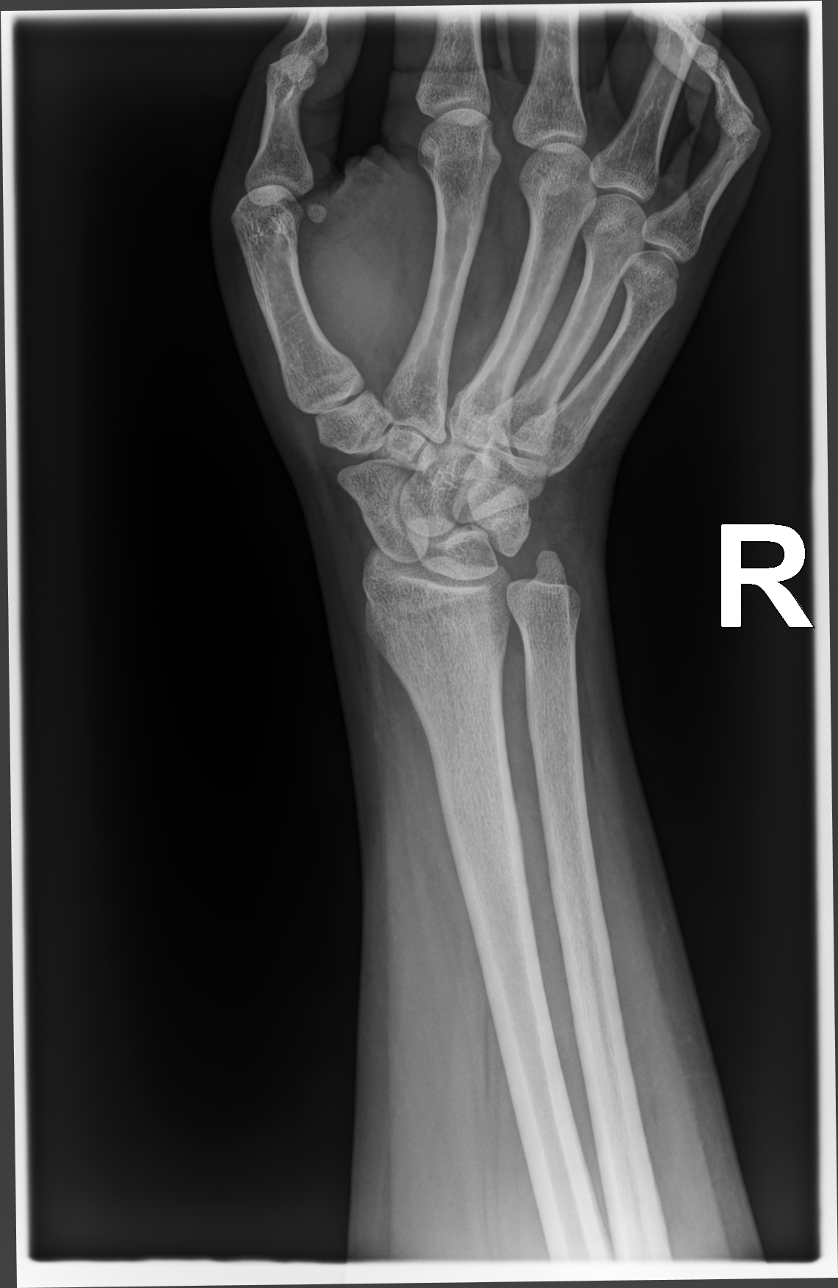

[wrist lat]
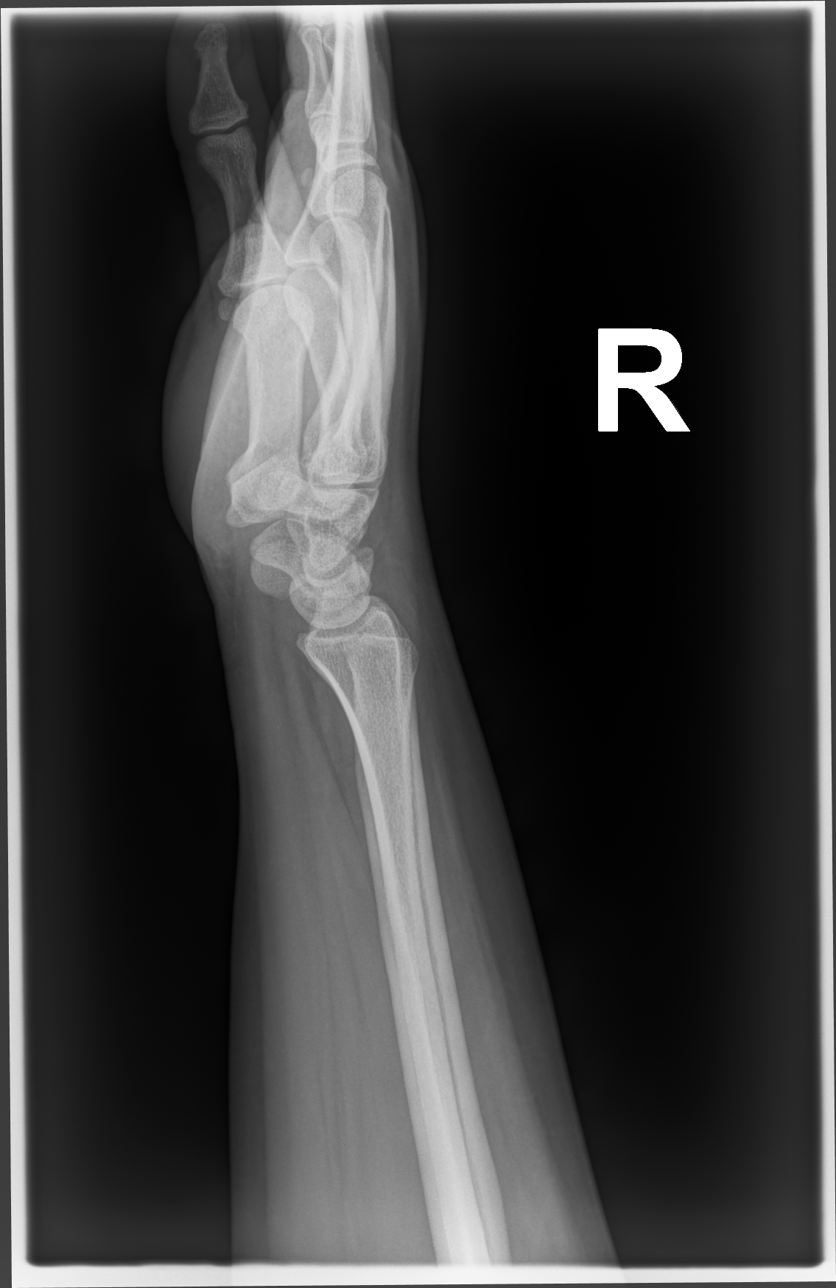

[scaphoid (os scaphoideum i) pa]
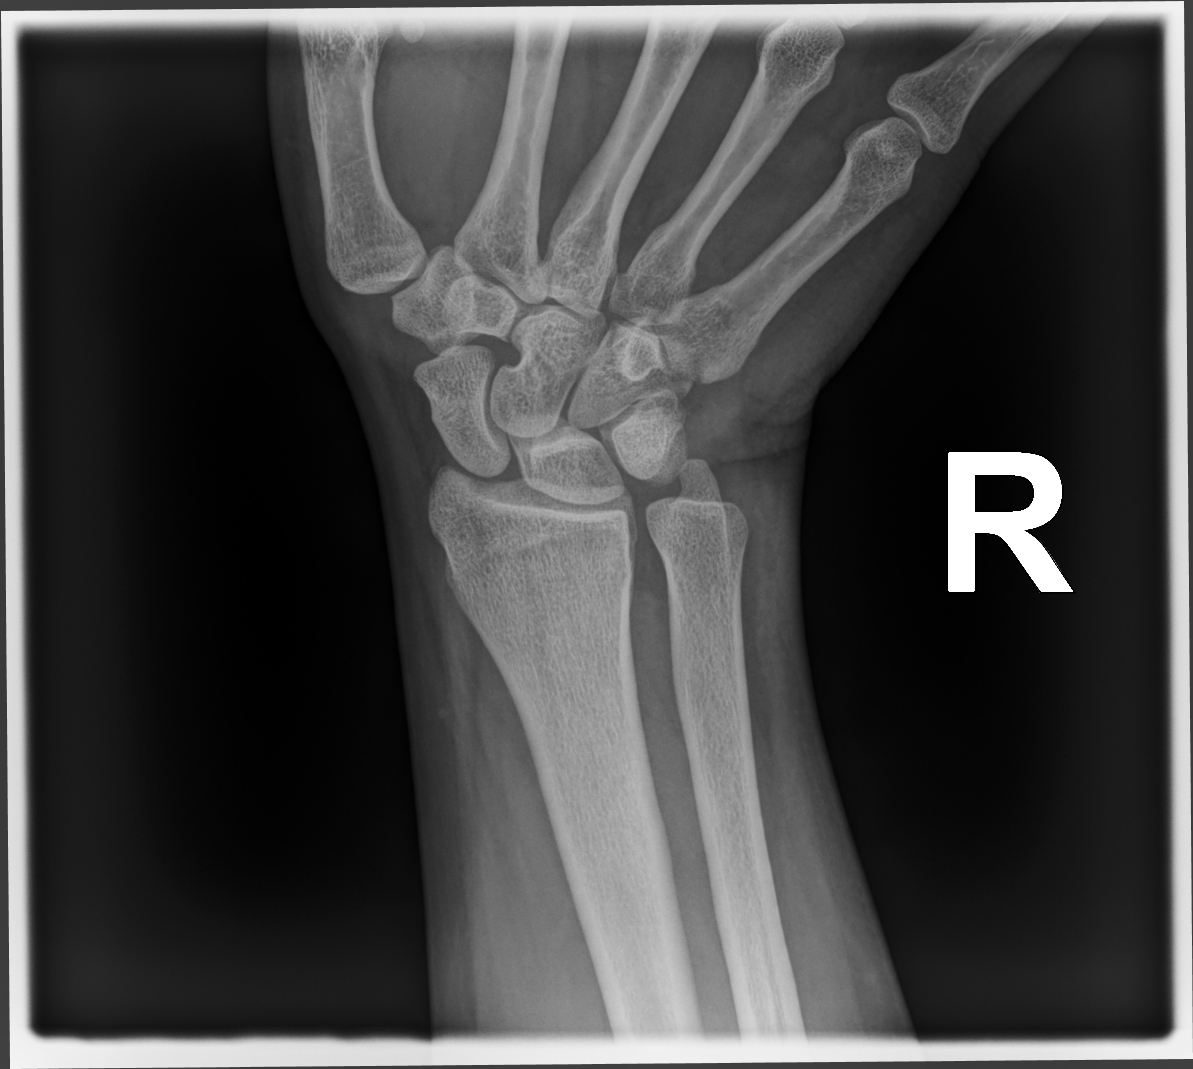

[4 of 4 positions shown; findings below may reference images not displayed]

FINDINGS: Neutral ulnar variance. Normal bone mineralization. Joint spaces are
preserved. No acute fracture is seen. No dislocation.
IMPRESSION: Normal right wrist radiographs.

## 2023-08-30 MED ORDER — HYDROMORPHONE HCL 1 MG/ML IJ SOLN
0.5000 mg | Freq: Once | INTRAMUSCULAR | Status: AC
  Administered 2023-08-30: 0.5 mg via INTRAVENOUS
  Filled 2023-08-30: qty 1

## 2023-08-30 MED ORDER — ONDANSETRON 4 MG PO TBDP: 4.0000 mg | ORAL_TABLET | Freq: Three times a day (TID) | ORAL | 0 refills | Status: DC | PRN

## 2023-08-30 MED ORDER — OXYCODONE HCL 5 MG PO TABS: 5.0000 mg | ORAL_TABLET | Freq: Four times a day (QID) | ORAL | 0 refills | Status: DC | PRN

## 2023-08-30 MED ORDER — NAPROXEN 500 MG PO TABS: 500.0000 mg | ORAL_TABLET | Freq: Two times a day (BID) | ORAL | 0 refills | Status: AC

## 2023-08-30 MED ORDER — KETOROLAC TROMETHAMINE 30 MG/ML IJ SOLN
30.0000 mg | Freq: Once | INTRAMUSCULAR | Status: AC
  Administered 2023-08-30: 30 mg via INTRAVENOUS
  Filled 2023-08-30: qty 1

## 2023-08-30 MED ORDER — NAPROXEN 500 MG PO TABS: 500.0000 mg | ORAL_TABLET | Freq: Two times a day (BID) | ORAL | 0 refills | Status: DC

## 2023-08-30 MED ORDER — OXYCODONE HCL 5 MG PO TABS
5.0000 mg | ORAL_TABLET | Freq: Once | ORAL | Status: AC
  Administered 2023-08-30: 5 mg via ORAL
  Filled 2023-08-30: qty 1

## 2023-08-30 MED ORDER — TAMSULOSIN HCL 0.4 MG PO CAPS: 0.4000 mg | ORAL_CAPSULE | Freq: Every day | ORAL | 0 refills | Status: DC

## 2023-08-30 MED ORDER — ONDANSETRON HCL 4 MG/2ML IJ SOLN
4.0000 mg | Freq: Once | INTRAMUSCULAR | Status: AC
Start: 2023-08-30 — End: 2023-08-30
  Administered 2023-08-30: 4 mg via INTRAVENOUS
  Filled 2023-08-30: qty 2

## 2023-08-30 MED ORDER — IOHEXOL 300 MG/ML  SOLN
100.0000 mL | Freq: Once | INTRAMUSCULAR | Status: AC | PRN
  Administered 2023-08-30: 100 mL via INTRAVENOUS

## 2023-08-30 NOTE — ED Notes (Signed)
Pt is back in room from CT

## 2023-08-30 NOTE — Discharge Instructions (Signed)
 Please read and follow all provided instructions.  Your diagnoses today include:  1. Ureteral colic     Tests performed today include: Urine test that showed blood in your urine and no infection CT scan which showed a 3 millimeter kidney stone on the right side Blood test that showed slightly weak kidney function Vital signs. See below for your results today.   Medications prescribed:  Oxycodone  - narcotic pain medication  DO NOT drive or perform any activities that require you to be awake and alert because this medicine can make you drowsy.   Zofran  (ondansetron ) - for nausea and vomiting  Flomax  (tamsulosin ) - relaxes smooth muscle to help kidney stones pass  Take any prescribed medications only as directed.  Home care instructions:  Follow any educational materials contained in this packet.  Please double your fluid intake for the next several days. Strain your urine and save any stones that may pass.   BE VERY CAREFUL not to take multiple medicines containing Tylenol  (also called acetaminophen ). Doing so can lead to an overdose which can damage your liver and cause liver failure and possibly death.   Follow-up instructions: Please follow-up with your urologist or the urologist referral (provided on front page) in the next 1 week for further evaluation of your symptoms.  Return instructions:  If you need to return to the Emergency Department, go to [facility] and not [facility]. The urologists are located at [facility] and can better care for you at this location.  Please return to the Emergency Department if you experience worsening symptoms.  Please return if you develop fever or uncontrolled pain or vomiting. Please return if you have any other emergent concerns.  Additional Information:  Your vital signs today were: BP (!) 129/93 (BP Location: Left Arm)   Pulse 74   Temp (!) 97.4 F (36.3 C) (Oral)   Resp 18   Wt 113.4 kg   SpO2 97%   BMI  44.29 kg/m  If your blood pressure (BP) was elevated above 135/85 this visit, please have this repeated by your doctor within one month. --------------

## 2023-08-30 NOTE — ED Notes (Signed)
Pt given sprite to drink for fluid challenge 

## 2023-08-30 NOTE — ED Triage Notes (Signed)
 BIB [facility] from home for RLQ pain, radiates to R flank. Endorses pain, NV. Denies diarrhea, fever, urinary or vaginal sx, or bleeding. No CVA TTP. EMS reports rebound TTP of RLQ. H/o kidney stones and IUD. VSS. No IV. CBG 100. VS: 108/66, HR 110, SPO2 98% RA.

## 2023-08-30 NOTE — ED Provider Notes (Signed)
 Received signout from previous provider pending p.o. challenge, reassessment.  See his note.  In short, patient presents emerged department for evaluation of right-sided abdominal pain that has been worsening with associated vomiting.  ED workup notable for negative hCG.  UA without obvious signs of infection.  Creatinine 1.07.  Leukocytosis of 18.9.  CT shows obstructing 3 mm right ureteral stone with mild hydronephrosis  Was provided Toradol , oxycodone  for pain and reports significant improvement of pain.  Passed p.o. challenge.  Ready and stable for DC.  Upon DC, patient request to change pharmacies to [facility].  I changed prescribe medications to this pharmacy and called previous pharmacy to ensure no narcotic medication was prescribed at previous pharmacy.  Discussed ED workup, disposition, return to ED precautions with patient who expresses understanding agrees with plan.  All questions answered to their satisfaction.  They are agreeable to plan.  Discharge instructions provided on paperwork   Minnie Tinnie BRAVO, PA 08/30/23 1648    Melvenia Motto, MD 08/30/23 2142

## 2023-08-30 NOTE — ED Notes (Signed)
 Alert, NAD, calm, interactive. States, I know I'm not constipated. Something is poking me in the back.

## 2023-08-30 NOTE — ED Notes (Signed)
To CT via w/c.

## 2023-08-30 NOTE — ED Provider Notes (Signed)
 [place] EMERGENCY DEPARTMENT AT [facility] Provider Note   CSN: [id number] Arrival date & time: 08/30/23  9061     Patient presents with: Abdominal Pain   Brittany Mcmahon is a 23 y.o. female.   Patient with no past abdominal surgical history presents to the emergency department today for evaluation of abdominal pain, right-sided, started yesterday afternoon, gradually worsening.  Pain does not radiate.  It is worse with movement and palpation.  Patient started having vomiting this morning.  She took [place] powder starting yesterday afternoon, last evening and this morning for pain.  She does not typically take this.  Denies heavy NSAID or alcohol use.  No urinary symptoms.  No diarrhea.  Last ate yesterday.       Prior to Admission medications   Medication Sig Start Date End Date Taking? Authorizing Provider  albuterol  (VENTOLIN  HFA) 108 (90 Base) MCG/ACT inhaler Inhale 1-2 puffs into the lungs every 6 (six) hours as needed for wheezing or shortness of breath. Patient not taking: Reported on 11/05/2021 04/06/21   Gabriella Arthor GAILS, MD  doxycycline  (VIBRAMYCIN ) 100 MG capsule Take 1 capsule (100 mg total) by mouth 2 (two) times daily. 01/17/23   Blaise Aleene KIDD, MD  levonorgestrel  (MIRENA ) 20 MCG/DAY IUD 1 each by Intrauterine route once.    [provider]  naproxen  (NAPROSYN ) 500 MG tablet Take 1 tablet (500 mg total) by mouth 2 (two) times daily. 06/23/23   Doretha Folks, MD    Allergies: Patient has no known allergies.    Review of Systems  Updated Vital Signs BP (!) 170/149 (BP Location: Right Arm)   Pulse 99   Temp (!) 97.4 F (36.3 C) (Oral)   Resp (!) 22   Wt 113.4 kg   SpO2 100%   BMI 44.29 kg/m   Physical Exam Vitals and nursing note reviewed.  Constitutional:      General: She is in acute distress (Appears uncomfortable, feels best with her left leg crossed).     Appearance: She is well-developed.  HENT:     Head: Normocephalic and  atraumatic.     Right Ear: External ear normal.     Left Ear: External ear normal.     Nose: Nose normal.  Eyes:     Conjunctiva/sclera: Conjunctivae normal.  Cardiovascular:     Rate and Rhythm: Normal rate and regular rhythm.     Heart sounds: No murmur heard. Pulmonary:     Effort: No respiratory distress.     Breath sounds: No wheezing, rhonchi or rales.  Abdominal:     Palpations: Abdomen is soft.     Tenderness: There is abdominal tenderness in the right lower quadrant. There is no guarding or rebound. Negative signs include Murphy's sign and McBurney's sign.  Musculoskeletal:     Cervical back: Normal range of motion and neck supple.     Right lower leg: No edema.     Left lower leg: No edema.  Skin:    General: Skin is warm and dry.     Findings: No rash.  Neurological:     General: No focal deficit present.     Mental Status: She is alert. Mental status is at baseline.     Motor: No weakness.  Psychiatric:        Mood and Affect: Mood normal.     (all labs ordered are listed, but only abnormal results are displayed) Labs Reviewed  COMPREHENSIVE METABOLIC PANEL WITH GFR - Abnormal; Notable for  the following components:      Result Value   BUN 21 (*)    Creatinine, Ser 1.07 (*)    Total Protein 8.8 (*)    All other components within normal limits  URINALYSIS, ROUTINE W REFLEX MICROSCOPIC - Abnormal; Notable for the following components:   APPearance HAZY (*)    Hgb urine dipstick LARGE (*)    Protein, ur 30 (*)    Leukocytes,Ua SMALL (*)    All other components within normal limits  CBC WITH DIFFERENTIAL/PLATELET - Abnormal; Notable for the following components:   WBC 18.9 (*)    RBC 5.18 (*)    Platelets 483 (*)    Neutro Abs 12.8 (*)    Monocytes Absolute 1.3 (*)    Eosinophils Absolute 0.7 (*)    Abs Immature Granulocytes 0.13 (*)    All other components within normal limits  LIPASE, BLOOD  HCG, SERUM, QUALITATIVE    EKG: None  Radiology: CT  ABDOMEN PELVIS W CONTRAST Result Date: 08/30/2023 CLINICAL DATA:  Worsening right lower quadrant abdominal pain EXAM: CT ABDOMEN AND PELVIS WITH CONTRAST TECHNIQUE: Multidetector CT imaging of the abdomen and pelvis was performed using the standard protocol following bolus administration of intravenous contrast. RADIATION DOSE REDUCTION: This exam was performed according to the departmental dose-optimization program which includes automated exposure control, adjustment of the mA and/or kV according to patient size and/or use of iterative reconstruction technique. CONTRAST:  OMNIPAQUE  IOHEXOL  300 MG/ML  SOLN COMPARISON:  CT abdomen and pelvis dated 10/02/2022 FINDINGS: Lower chest: No focal consolidation or pulmonary nodule in the lung bases. No pleural effusion or pneumothorax demonstrated. Partially imaged heart size is normal. Hepatobiliary: No focal hepatic lesions. No intra or extrahepatic biliary ductal dilation. Normal gallbladder. Pancreas: No focal lesions or main ductal dilation. Spleen: Normal in size without focal abnormality. Adrenals/Urinary Tract: No adrenal nodules. Asymmetric hypoenhancement of the right kidney, which demonstrates mild hydronephrosis upstream of a 3 mm proximal ureteral stone. Additional stones within lower pole calyceal diverticuli, increased compared to 10/02/2022. No left hydronephrosis or calculi. No focal bladder wall thickening. Stomach/Bowel: Normal appearance of the stomach. No evidence of bowel wall thickening, distention, or inflammatory changes. Normal appendix. Vascular/Lymphatic: No significant vascular findings are present. No enlarged abdominal or pelvic lymph nodes. Reproductive: Appropriately positioned intrauterine device. No adnexal masses. Other: No free fluid, fluid collection, or free air. Musculoskeletal: No acute or abnormal lytic or blastic osseous lesions. IMPRESSION: 1. Obstructing 3 mm proximal right ureteral stone with mild hydronephrosis. 2.  Additional stones within right lower pole calyceal diverticuli, increased from 10/02/2022. Electronically Signed   By: Limin  Xu M.D.   On: 08/30/2023 13:28     Procedures   Medications Ordered in the ED  ondansetron  (ZOFRAN ) injection 4 mg (has no administration in time range)   ED Course  Patient seen and examined. History obtained directly from patient.   Labs/EKG: Ordered CBC, CMP, lipase, UA, pregnancy  Imaging: Ordered CT abdomen pelvis with contrast  Medications/Fluids: Ordered: Zofran , offered pain medication, patient currently declines  Most recent vital signs reviewed and are as follows: BP (!) 170/149 (BP Location: Right Arm)   Pulse 99   Temp (!) 97.4 F (36.3 C) (Oral)   Resp (!) 22   Wt 113.4 kg   SpO2 100%   BMI 44.29 kg/m   Initial impression: Right-sided abdominal pain  1:44 PM Reassessment performed. Patient appears improved but uncomfortable.  Labs personally reviewed and interpreted including: CBC with elevated  white blood cell count at 18.9, hemoglobin normal; CMP creatinine minimally elevated at 1.07 with a BUN of 21, otherwise unremarkable; lipase normal; UA with blood, some white cells, negative nitrite, no bacteria seen; pregnancy negative.  Imaging personally visualized and interpreted including: CT shows hydronephrosis, 3 mm proximal ureteral stone.  Reviewed pertinent lab work and imaging with patient at bedside. Questions answered.   Most current vital signs reviewed and are as follows: BP (!) 155/107   Pulse 94   Temp (!) 97.4 F (36.3 C) (Oral)   Resp (!) 22   Wt 113.4 kg   SpO2 98%   BMI 44.29 kg/m   Plan: Will continue to control pain.  Toradol  ordered.  Hopefully when improved, discharged with symptom control, outpatient urology follow-up.  3:05 PM Reassessment performed. Patient appears improved, she has been sleeping.  Reviewed pertinent lab work and imaging with patient at bedside. Questions answered.   Most current vital  signs reviewed and are as follows: BP (!) 129/93 (BP Location: Left Arm)   Pulse 74   Temp (!) 97.4 F (36.3 C) (Oral)   Resp 18   Wt 113.4 kg   SpO2 97%   BMI 44.29 kg/m   Plan: P.o. challenge, will give dose of p.o. oxycodone .  Patient requesting apple juice.  Will likely be able to discharge after recheck. Minnie PA-C aware at shift change.   Patient counseled on kidney stone treatment. Urged patient to strain urine and save any stones. Urged urology follow-up and return to [facility] with any complications. Counseled patient to maintain good fluid intake.   Counseled patient on use of Flomax .   Patient counseled on use of narcotic pain medications. Counseled not to combine these medications with others containing tylenol . Urged not to drink alcohol, drive, or perform any other activities that requires focus while taking these medications. The patient verbalizes understanding and agrees with the plan.                                   Medical Decision Making Amount and/or Complexity of Data Reviewed Labs: ordered. Radiology: ordered.  Risk Prescription drug management.   For this patient's complaint of abdominal pain, the following conditions were considered on the differential diagnosis: gastritis/PUD, enteritis/duodenitis, appendicitis, cholelithiasis/cholecystitis, cholangitis, pancreatitis, ruptured viscus, colitis, diverticulitis, small/large bowel obstruction, proctitis, cystitis, pyelonephritis, ureteral colic, aortic dissection, aortic aneurysm. In women, ectopic pregnancy, pelvic inflammatory disease, ovarian cysts, and tubo-ovarian abscess were also considered. Atypical chest etiologies were also considered including ACS, PE, and pneumonia.  Workup consistent with right-sided ureteral colic.  Normal appendix.  Kidney stone is proximal.  Flomax  given as well.  Encouraged outpatient urology follow-up.  Minimally elevated creatinine today, patient urinating normally.  No signs of  pyelonephritis.  The patient's vital signs, pertinent lab work and imaging were reviewed and interpreted as discussed in the ED course. Hospitalization was considered for further testing, treatments, or serial exams/observation. However as patient is well-appearing, has a stable exam, and reassuring studies today, I do not feel that they warrant admission at this time. This plan was discussed with the patient who verbalizes agreement and comfort with this plan and seems reliable and able to return to the Emergency Department with worsening or changing symptoms.        Final diagnoses:  Ureteral colic    ED Discharge Orders          Ordered  oxyCODONE  (OXY IR/ROXICODONE ) 5 MG immediate release tablet  Every 6 hours PRN        08/30/23 1504    tamsulosin  (FLOMAX ) 0.4 MG CAPS capsule  Daily        08/30/23 1504    ondansetron  (ZOFRAN -ODT) 4 MG disintegrating tablet  Every 8 hours PRN        08/30/23 1504    naproxen  (NAPROSYN ) 500 MG tablet  2 times daily        08/30/23 1504               Desiderio Chew, PA-C 08/30/23 1507    Elnor Jayson LABOR, DO 08/31/23 [id number]

## 2023-08-31 ENCOUNTER — Observation Stay (HOSPITAL_COMMUNITY)
Admission: EM | Admit: 2023-08-31 | Discharge: 2023-09-02 | Disposition: A | Discharge status: Home / Self Care | Attending: Urology | Admitting: Urology

## 2023-08-31 DIAGNOSIS — R109 Unspecified abdominal pain: Secondary | ICD-10-CM | POA: Diagnosis present

## 2023-08-31 DIAGNOSIS — J45909 Unspecified asthma, uncomplicated: Secondary | ICD-10-CM | POA: Diagnosis not present

## 2023-08-31 DIAGNOSIS — N201 Calculus of ureter: Principal | ICD-10-CM | POA: Insufficient documentation

## 2023-08-31 LAB — CBC WITH DIFFERENTIAL/PLATELET
Abs Immature Granulocytes: 0.11 K/uL — ABNORMAL HIGH (ref 0.00–0.07)
Basophils Absolute: 0.1 K/uL (ref 0.0–0.1)
Basophils Relative: 1 %
Eosinophils Absolute: 0.1 K/uL (ref 0.0–0.5)
Eosinophils Relative: 1 %
HCT: 34.2 % — ABNORMAL LOW (ref 36.0–46.0)
Hemoglobin: 10.8 g/dL — ABNORMAL LOW (ref 12.0–15.0)
Immature Granulocytes: 1 %
Lymphocytes Relative: 8 %
Lymphs Abs: 1.3 K/uL (ref 0.7–4.0)
MCH: 27.8 pg (ref 26.0–34.0)
MCHC: 31.6 g/dL (ref 30.0–36.0)
MCV: 88.1 fL (ref 80.0–100.0)
Monocytes Absolute: 1.5 K/uL — ABNORMAL HIGH (ref 0.1–1.0)
Monocytes Relative: 9 %
Neutro Abs: 13.3 K/uL — ABNORMAL HIGH (ref 1.7–7.7)
Neutrophils Relative %: 80 %
Platelets: 337 K/uL (ref 150–400)
RBC: 3.88 MIL/uL (ref 3.87–5.11)
RDW: 14.3 % (ref 11.5–15.5)
WBC: 16.3 K/uL — ABNORMAL HIGH (ref 4.0–10.5)
nRBC: 0 % (ref 0.0–0.2)

## 2023-08-31 LAB — BASIC METABOLIC PANEL WITH GFR
Anion gap: 10 (ref 5–15)
BUN: 16 mg/dL (ref 6–20)
CO2: 26 mmol/L (ref 22–32)
Calcium: 8.7 mg/dL — ABNORMAL LOW (ref 8.9–10.3)
Chloride: 100 mmol/L (ref 98–111)
Creatinine, Ser: 1.09 mg/dL — ABNORMAL HIGH (ref 0.44–1.00)
GFR, Estimated: >60 mL/min (ref >60)
Glucose, Bld: 105 mg/dL — ABNORMAL HIGH (ref 70–99)
Potassium: 3.6 mmol/L (ref 3.5–5.1)
Sodium: 136 mmol/L (ref 135–145)

## 2023-08-31 LAB — URINALYSIS, ROUTINE W REFLEX MICROSCOPIC
Bilirubin Urine: NEGATIVE
Glucose, UA: NEGATIVE mg/dL
Ketones, ur: NEGATIVE mg/dL
Nitrite: NEGATIVE
Protein, ur: 100 mg/dL — AB
Specific Gravity, Urine: 1.038 — ABNORMAL HIGH (ref 1.005–1.030)
WBC, UA: >50 WBC/hpf (ref 0–5)
pH: 5 (ref 5.0–8.0)

## 2023-08-31 LAB — LACTIC ACID, PLASMA: Lactic Acid, Venous: 0.9 mmol/L (ref 0.5–1.9)

## 2023-08-31 MED ORDER — KETOROLAC TROMETHAMINE 15 MG/ML IJ SOLN
15.0000 mg | Freq: Once | INTRAMUSCULAR | Status: AC
  Administered 2023-08-31: 15 mg via INTRAVENOUS
  Filled 2023-08-31: qty 1

## 2023-08-31 MED ORDER — DIPHENHYDRAMINE HCL 50 MG/ML IJ SOLN: 12.5000 mg | Freq: Four times a day (QID) | INTRAMUSCULAR | Status: DC | PRN

## 2023-08-31 MED ORDER — SODIUM CHLORIDE 0.9 % IV SOLN
1.0000 g | INTRAVENOUS | Status: DC
  Administered 2023-09-01: 1 g via INTRAVENOUS
  Filled 2023-08-31 (×2): qty 10

## 2023-08-31 MED ORDER — HYDROMORPHONE HCL 1 MG/ML IJ SOLN
0.5000 mg | Freq: Once | INTRAMUSCULAR | Status: AC
  Administered 2023-08-31: 0.5 mg via INTRAVENOUS
  Filled 2023-08-31: qty 1

## 2023-08-31 MED ORDER — HYDROMORPHONE HCL 1 MG/ML IJ SOLN
0.5000 mg | Freq: Once | INTRAMUSCULAR | Status: AC
  Administered 2023-08-31: 0.5 mg via INTRAVENOUS
  Filled 2023-08-31 (×2): qty 1

## 2023-08-31 MED ORDER — DIPHENHYDRAMINE HCL 50 MG/ML IJ SOLN
12.5000 mg | Freq: Once | INTRAMUSCULAR | Status: AC
  Administered 2023-08-31: 12.5 mg via INTRAVENOUS
  Filled 2023-08-31: qty 1

## 2023-08-31 MED ORDER — SODIUM CHLORIDE 0.9 % IV SOLN: INTRAVENOUS | Status: AC

## 2023-08-31 MED ORDER — TAMSULOSIN HCL 0.4 MG PO CAPS
0.4000 mg | ORAL_CAPSULE | Freq: Every day | ORAL | Status: DC
  Administered 2023-09-01 – 2023-09-02 (×2): 0.4 mg via ORAL
  Filled 2023-08-31 (×2): qty 1

## 2023-08-31 MED ORDER — ONDANSETRON HCL 4 MG/2ML IJ SOLN
4.0000 mg | INTRAMUSCULAR | Status: DC | PRN
  Administered 2023-09-01 (×4): 4 mg via INTRAVENOUS
  Filled 2023-08-31 (×3): qty 2

## 2023-08-31 MED ORDER — DIPHENHYDRAMINE HCL 12.5 MG/5ML PO ELIX: 12.5000 mg | ORAL_SOLUTION | Freq: Four times a day (QID) | ORAL | Status: DC | PRN

## 2023-08-31 MED ORDER — LACTATED RINGERS IV BOLUS
1000.0000 mL | Freq: Once | INTRAVENOUS | Status: AC
  Administered 2023-08-31: 1000 mL via INTRAVENOUS

## 2023-08-31 MED ORDER — KETOROLAC TROMETHAMINE 15 MG/ML IJ SOLN
15.0000 mg | Freq: Four times a day (QID) | INTRAMUSCULAR | Status: DC
  Administered 2023-09-01 (×3): 15 mg via INTRAVENOUS
  Filled 2023-08-31 (×4): qty 1

## 2023-08-31 MED ORDER — PROCHLORPERAZINE EDISYLATE 10 MG/2ML IJ SOLN
10.0000 mg | Freq: Once | INTRAMUSCULAR | Status: AC
  Administered 2023-08-31: 10 mg via INTRAVENOUS
  Filled 2023-08-31: qty 2

## 2023-08-31 MED ORDER — SODIUM CHLORIDE 0.9 % IV SOLN
2.0000 g | Freq: Once | INTRAVENOUS | Status: AC
  Administered 2023-08-31: 2 g via INTRAVENOUS
  Filled 2023-08-31: qty 20

## 2023-08-31 MED ORDER — HYDROMORPHONE HCL 1 MG/ML IJ SOLN
0.5000 mg | INTRAMUSCULAR | Status: DC | PRN
  Administered 2023-09-01 (×3): 1 mg via INTRAVENOUS
  Filled 2023-08-31 (×3): qty 1

## 2023-08-31 MED ORDER — ONDANSETRON HCL 4 MG/2ML IJ SOLN
4.0000 mg | Freq: Once | INTRAMUSCULAR | Status: AC
  Administered 2023-08-31: 4 mg via INTRAVENOUS
  Filled 2023-08-31: qty 2

## 2023-08-31 MED ORDER — ALBUTEROL SULFATE HFA 108 (90 BASE) MCG/ACT IN AERS: 1.0000 | INHALATION_SPRAY | Freq: Four times a day (QID) | RESPIRATORY_TRACT | Status: DC | PRN

## 2023-08-31 NOTE — ED Triage Notes (Signed)
 Patient BIB EMS complaining of Rt flank and Rt lower abdominal pain. Was seen and treated for kidney stone yesterday at [facility] ED. States meds prescribed at discharge is not helping. Rates pain 10/10. Complains of N/V.     Given per EMS:  20g L AC 4mg  Zofran  100mcg Fentanyl 

## 2023-08-31 NOTE — ED Provider Notes (Signed)
 [place] EMERGENCY DEPARTMENT AT [facility] Provider Note   CSN: [id number] Arrival date & time: 08/31/23  8366     Patient presents with: No chief complaint on file.   Brittany Mcmahon is a 23 y.o. female.   23 year old female with recently diagnosed ureteral stone presenting to the emergency department today with persistent pain, nausea, and vomiting.  The patient states she was worked up yesterday and was diagnosed with a ureteral stone.  Was feeling better and was discharged.  She states that despite taking oxycodone  and naproxen  today that her symptoms have persisted.  She states that these do not helping with her pain.  She has had multiple episodes of nonbloody, nonbilious emesis since then.  Denies any associated fevers.        Prior to Admission medications   Medication Sig Start Date End Date Taking? Authorizing Provider  albuterol  (VENTOLIN  HFA) 108 (90 Base) MCG/ACT inhaler Inhale 1-2 puffs into the lungs every 6 (six) hours as needed for wheezing or shortness of breath. 04/06/21   Gabriella Arthor GAILS, MD  levonorgestrel  (MIRENA ) 20 MCG/DAY IUD 1 each by Intrauterine route once.    [provider]  naproxen  (NAPROSYN ) 500 MG tablet Take 1 tablet (500 mg total) by mouth 2 (two) times daily. 08/30/23   Minnie Tinnie BRAVO, PA  ondansetron  (ZOFRAN -ODT) 4 MG disintegrating tablet Take 1 tablet (4 mg total) by mouth every 8 (eight) hours as needed for nausea or vomiting. 08/30/23   Minnie Tinnie BRAVO, PA  oxyCODONE  (OXY IR/ROXICODONE ) 5 MG immediate release tablet Take 1 tablet (5 mg total) by mouth every 6 (six) hours as needed for severe pain (pain score 7-10). 08/30/23   Minnie Tinnie BRAVO, PA  tamsulosin  (FLOMAX ) 0.4 MG CAPS capsule Take 1 capsule (0.4 mg total) by mouth daily. 08/30/23   Minnie Tinnie BRAVO, PA    Allergies: Patient has no known allergies.    Review of Systems  Gastrointestinal:  Positive for nausea and vomiting.  Genitourinary:  Positive for flank pain.   All other systems reviewed and are negative.   Updated Vital Signs BP (!) 153/94   Pulse (!) 110   Temp 98.5 F (36.9 C)   Resp 18   SpO2 97%   Physical Exam Vitals and nursing note reviewed.   Gen: Appears uncomfortable Eyes: PERRL, EOMI HEENT: no oropharyngeal swelling Neck: trachea midline Resp: clear to auscultation bilaterally Card: RRR, no murmurs, rubs, or gallops Abd: Right-sided CVA tenderness noted Extremities: no calf tenderness, no edema Vascular: 2+ radial pulses bilaterally, 2+ DP pulses bilaterally Skin: no rashes Psyc: acting appropriately   (all labs ordered are listed, but only abnormal results are displayed) Labs Reviewed  CBC WITH DIFFERENTIAL/PLATELET - Abnormal; Notable for the following components:      Result Value   WBC 16.3 (*)    Hemoglobin 10.8 (*)    HCT 34.2 (*)    Neutro Abs 13.3 (*)    Monocytes Absolute 1.5 (*)    Abs Immature Granulocytes 0.11 (*)    All other components within normal limits  URINALYSIS, ROUTINE W REFLEX MICROSCOPIC - Abnormal; Notable for the following components:   Color, Urine AMBER (*)    APPearance HAZY (*)    Specific Gravity, Urine 1.038 (*)    Hgb urine dipstick MODERATE (*)    Protein, ur 100 (*)    Leukocytes,Ua SMALL (*)    Bacteria, UA FEW (*)    All other components within normal limits  BASIC METABOLIC PANEL WITH GFR - Abnormal; Notable for the following components:   Glucose, Bld 105 (*)    Creatinine, Ser 1.09 (*)    Calcium  8.7 (*)    All other components within normal limits  CULTURE, BLOOD (ROUTINE X 2)  CULTURE, BLOOD (ROUTINE X 2)  LACTIC ACID, PLASMA  LACTIC ACID, PLASMA    EKG: None  Radiology: CT ABDOMEN PELVIS W CONTRAST Result Date: 08/30/2023 CLINICAL DATA:  Worsening right lower quadrant abdominal pain EXAM: CT ABDOMEN AND PELVIS WITH CONTRAST TECHNIQUE: Multidetector CT imaging of the abdomen and pelvis was performed using the standard protocol following bolus  administration of intravenous contrast. RADIATION DOSE REDUCTION: This exam was performed according to the departmental dose-optimization program which includes automated exposure control, adjustment of the mA and/or kV according to patient size and/or use of iterative reconstruction technique. CONTRAST:  OMNIPAQUE  IOHEXOL  300 MG/ML  SOLN COMPARISON:  CT abdomen and pelvis dated 10/02/2022 FINDINGS: Lower chest: No focal consolidation or pulmonary nodule in the lung bases. No pleural effusion or pneumothorax demonstrated. Partially imaged heart size is normal. Hepatobiliary: No focal hepatic lesions. No intra or extrahepatic biliary ductal dilation. Normal gallbladder. Pancreas: No focal lesions or main ductal dilation. Spleen: Normal in size without focal abnormality. Adrenals/Urinary Tract: No adrenal nodules. Asymmetric hypoenhancement of the right kidney, which demonstrates mild hydronephrosis upstream of a 3 mm proximal ureteral stone. Additional stones within lower pole calyceal diverticuli, increased compared to 10/02/2022. No left hydronephrosis or calculi. No focal bladder wall thickening. Stomach/Bowel: Normal appearance of the stomach. No evidence of bowel wall thickening, distention, or inflammatory changes. Normal appendix. Vascular/Lymphatic: No significant vascular findings are present. No enlarged abdominal or pelvic lymph nodes. Reproductive: Appropriately positioned intrauterine device. No adnexal masses. Other: No free fluid, fluid collection, or free air. Musculoskeletal: No acute or abnormal lytic or blastic osseous lesions. IMPRESSION: 1. Obstructing 3 mm proximal right ureteral stone with mild hydronephrosis. 2. Additional stones within right lower pole calyceal diverticuli, increased from 10/02/2022. Electronically Signed   By: Limin  Xu M.D.   On: 08/30/2023 13:28     Procedures   Medications Ordered in the ED  HYDROmorphone  (DILAUDID ) injection 0.5 mg (0.5 mg Intravenous Given  08/31/23 1806)  ondansetron  (ZOFRAN ) injection 4 mg (4 mg Intravenous Given 08/31/23 1716)  lactated ringers  bolus 1,000 mL (0 mLs Intravenous Stopped 08/31/23 1841)  cefTRIAXone  (ROCEPHIN ) 2 g in sodium chloride  0.9 % 100 mL IVPB (0 g Intravenous Stopped 08/31/23 1841)  ketorolac  (TORADOL ) 15 MG/ML injection 15 mg (15 mg Intravenous Given 08/31/23 1919)  HYDROmorphone  (DILAUDID ) injection 0.5 mg (0.5 mg Intravenous Given 08/31/23 1920)  HYDROmorphone  (DILAUDID ) injection 0.5 mg (0.5 mg Intravenous Given 08/31/23 2232)  prochlorperazine  (COMPAZINE ) injection 10 mg (10 mg Intravenous Given 08/31/23 2233)  diphenhydrAMINE  (BENADRYL ) injection 12.5 mg (12.5 mg Intravenous Given 08/31/23 2233)                                    Medical Decision Making 23 year old female presenting to the emergency department today with right-sided flank pain after being diagnosed with ureteral stone.  She denies any fevers but temp here today is 100.3.  Heart rate is also elevated at 111.  I give patient IV fluids and obtain lactic acid.  She did have a leukocytosis cytosis and pyuria yesterday but was afebrile at that point.  I will obtain a lactic acid and blood cultures  and cover the patient empirically with Rocephin .  Will treat her symptoms and repeat lab work.  I will call discuss her case with urology after initial workup is complete.  Will hold off on repeat scanning at this time.  The patient does have persistent leukocytosis here.  Urinalysis does show some leukocytes and increased WBCs.  She is given Rocephin .  I did call discuss her case with Dr. Renda after 4 rounds of pain and nausea medications and persistent symptoms.  He will admit the patient for further evaluation and stenting tomorrow.  Amount and/or Complexity of Data Reviewed Labs: ordered.  Risk Prescription drug management.        Final diagnoses:  Ureterolithiasis    ED Discharge Orders     None          Ula Prentice SAUNDERS,  MD 08/31/23 2235

## 2023-08-31 NOTE — H&P (Signed)
 Urology Consult   Physician requesting consult: Dr. Prentice Medicus  Reason for consult: Right ureteral calculus  History of Present Illness: Brittany Mcmahon is a 23 y.o. with uncontrolled right flank and abdominal pain.  She was seen in the ED yesterday with similar complaints and a CT scan revealed a 3 mm proximal right ureteral calculus.  She returned today with worsening pain despite oral pain medication. Her pain has not been able to be controlled in the ED.  She has had low grade temperature to 100.3 but is currently afebrile and stable.  Admission is requested due to poor pain control.    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   Current Outpatient Medications on File Prior to Encounter  Medication Sig Dispense Refill   albuterol  (VENTOLIN  HFA) 108 (90 Base) MCG/ACT inhaler Inhale 1-2 puffs into the lungs every 6 (six) hours as needed for wheezing or shortness of breath. 2 each 1   levonorgestrel  (MIRENA ) 20 MCG/DAY IUD 1 each by Intrauterine route once.     naproxen  (NAPROSYN ) 500 MG tablet Take 1 tablet (500 mg total) by mouth 2 (two) times daily. 20 tablet 0   ondansetron  (ZOFRAN -ODT) 4 MG disintegrating tablet Take 1 tablet (4 mg total) by mouth every 8 (eight) hours as needed for nausea or vomiting. 10 tablet 0   oxyCODONE  (OXY IR/ROXICODONE ) 5 MG immediate release tablet Take 1 tablet (5 mg total) by mouth every 6 (six) hours as needed for severe pain (pain score 7-10). 8 tablet 0   tamsulosin  (FLOMAX ) 0.4 MG CAPS capsule Take 1 capsule (0.4 mg total) by mouth daily. 7 capsule 0     Scheduled  Meds: Continuous Infusions: PRN Meds:.  Allergies: No Known Allergies  Family History  Problem Relation Age of Onset   Hypertension Mother    Obesity Mother    Hypertension Father    Hypertension Maternal Grandmother    Asthma Maternal Uncle     Social History:  reports that she has never smoked. She has been exposed to tobacco smoke. She has never used smokeless tobacco. She reports that she does not drink alcohol and does not use drugs.  ROS: A complete review of systems was performed.  All systems are negative except for pertinent findings as noted.  Physical Exam:  Vital signs in last 24 hours: Temp:  [98.5 F (36.9 C)-100.3 F (37.9 C)] 98.5 F (36.9 C) (07/30 1930) Pulse Rate:  [100-111] 110 (07/30 2130) Resp:  [18] 18 (07/30 2130) BP: (139-157)/(79-95) 153/94 (07/30 2130) SpO2:  [97 %] 97 % (07/30 2130) Constitutional:  Alert and oriented, No acute distress Cardiovascular: Regular rate and rhythm, No JVD Respiratory: Normal respiratory effort, Lungs clear bilaterally GI: Abdomen is soft, nontender, nondistended, no abdominal masses GU: No CVA tenderness Lymphatic: No lymphadenopathy Neurologic: Grossly intact, no focal deficits Psychiatric: Normal mood and affect  Laboratory Data:  Recent Labs    08/30/23 1000 08/31/23 1653  WBC 18.9* 16.3*  HGB 14.0 10.8*  HCT 44.9 34.2*  PLT 483* 337    Recent Labs  08/30/23 1000 08/31/23 1653  NA 140 136  K 3.9 3.6  CL 105 100  GLUCOSE 99 105*  BUN 21* 16  CALCIUM  9.7 8.7*  CREATININE 1.07* 1.09*     Results for orders placed or performed during the hospital encounter of 08/31/23 (from the past 24 hours)  CBC with Differential     Status: Abnormal   Collection Time: 08/31/23  4:53 PM  Result Value Ref Range   WBC 16.3 (H) 4.0 - 10.5 K/uL   RBC 3.88 3.87 - 5.11 MIL/uL   Hemoglobin 10.8 (L) 12.0 - 15.0 g/dL   HCT 65.7 (L) 63.9 - 53.9 %   MCV 88.1 80.0 - 100.0 fL   MCH 27.8 26.0 - 34.0 pg   MCHC 31.6  30.0 - 36.0 g/dL   RDW 85.6 88.4 - 84.4 %   Platelets 337 150 - 400 K/uL   nRBC 0.0 0.0 - 0.2 %   Neutrophils Relative % 80 %   Neutro Abs 13.3 (H) 1.7 - 7.7 K/uL   Lymphocytes Relative 8 %   Lymphs Abs 1.3 0.7 - 4.0 K/uL   Monocytes Relative 9 %   Monocytes Absolute 1.5 (H) 0.1 - 1.0 K/uL   Eosinophils Relative 1 %   Eosinophils Absolute 0.1 0.0 - 0.5 K/uL   Basophils Relative 1 %   Basophils Absolute 0.1 0.0 - 0.1 K/uL   Immature Granulocytes 1 %   Abs Immature Granulocytes 0.11 (H) 0.00 - 0.07 K/uL  Basic metabolic panel     Status: Abnormal   Collection Time: 08/31/23  4:53 PM  Result Value Ref Range   Sodium 136 135 - 145 mmol/L   Potassium 3.6 3.5 - 5.1 mmol/L   Chloride 100 98 - 111 mmol/L   CO2 26 22 - 32 mmol/L   Glucose, Bld 105 (H) 70 - 99 mg/dL   BUN 16 6 - 20 mg/dL   Creatinine, Ser 8.90 (H) 0.44 - 1.00 mg/dL   Calcium  8.7 (L) 8.9 - 10.3 mg/dL   GFR, Estimated >39 >39 mL/min   Anion gap 10 5 - 15  Urinalysis, Routine w reflex microscopic -Urine, Clean Catch     Status: Abnormal   Collection Time: 08/31/23  5:01 PM  Result Value Ref Range   Color, Urine AMBER (A) YELLOW   APPearance HAZY (A) CLEAR   Specific Gravity, Urine 1.038 (H) 1.005 - 1.030   pH 5.0 5.0 - 8.0   Glucose, UA NEGATIVE NEGATIVE mg/dL   Hgb urine dipstick MODERATE (A) NEGATIVE   Bilirubin Urine NEGATIVE NEGATIVE   Ketones, ur NEGATIVE NEGATIVE mg/dL   Protein, ur 899 (A) NEGATIVE mg/dL   Nitrite NEGATIVE NEGATIVE   Leukocytes,Ua SMALL (A) NEGATIVE   RBC / HPF 21-50 0 - 5 RBC/hpf   WBC, UA >50 0 - 5 WBC/hpf   Bacteria, UA FEW (A) NONE SEEN   Squamous Epithelial / HPF 21-50 0 - 5 /HPF   Mucus PRESENT   Lactic acid, plasma     Status: None   Collection Time: 08/31/23  6:00 PM  Result Value Ref Range   Lactic Acid, Venous 0.9 0.5 - 1.9 mmol/L   No results found for this or any previous visit (from the past 240 hours).  Renal Function: Recent Labs    08/30/23 1000 08/31/23 1653   CREATININE 1.07* 1.09*   CrCl cannot be calculated (Unknown ideal weight.).  Radiologic Imaging: CT ABDOMEN PELVIS W CONTRAST Result Date: 08/30/2023 CLINICAL DATA:  Worsening  right lower quadrant abdominal pain EXAM: CT ABDOMEN AND PELVIS WITH CONTRAST TECHNIQUE: Multidetector CT imaging of the abdomen and pelvis was performed using the standard protocol following bolus administration of intravenous contrast. RADIATION DOSE REDUCTION: This exam was performed according to the departmental dose-optimization program which includes automated exposure control, adjustment of the mA and/or kV according to patient size and/or use of iterative reconstruction technique. CONTRAST:  OMNIPAQUE  IOHEXOL  300 MG/ML  SOLN COMPARISON:  CT abdomen and pelvis dated 10/02/2022 FINDINGS: Lower chest: No focal consolidation or pulmonary nodule in the lung bases. No pleural effusion or pneumothorax demonstrated. Partially imaged heart size is normal. Hepatobiliary: No focal hepatic lesions. No intra or extrahepatic biliary ductal dilation. Normal gallbladder. Pancreas: No focal lesions or main ductal dilation. Spleen: Normal in size without focal abnormality. Adrenals/Urinary Tract: No adrenal nodules. Asymmetric hypoenhancement of the right kidney, which demonstrates mild hydronephrosis upstream of a 3 mm proximal ureteral stone. Additional stones within lower pole calyceal diverticuli, increased compared to 10/02/2022. No left hydronephrosis or calculi. No focal bladder wall thickening. Stomach/Bowel: Normal appearance of the stomach. No evidence of bowel wall thickening, distention, or inflammatory changes. Normal appendix. Vascular/Lymphatic: No significant vascular findings are present. No enlarged abdominal or pelvic lymph nodes. Reproductive: Appropriately positioned intrauterine device. No adnexal masses. Other: No free fluid, fluid collection, or free air. Musculoskeletal: No acute or abnormal lytic or blastic  osseous lesions. IMPRESSION: 1. Obstructing 3 mm proximal right ureteral stone with mild hydronephrosis. 2. Additional stones within right lower pole calyceal diverticuli, increased from 10/02/2022. Electronically Signed   By: Limin  Xu M.D.   On: 08/30/2023 13:28    I independently reviewed the above imaging studies.  Impression/Recommendation: Right ureteral calculus:  With her uncontrolled pain, low grade fever earlier, and elevated WBC, will admit her for IV pain control and antibiotic therapy.  Will re-evaluate her in the morning to consider intervention with a ureteral stent tomorrow if necessary or sooner tonight if she become febrile or demonstrates other symptoms of systemic infection.  Noretta Ferrara 08/31/2023, 10:34 PM  Gretel CANDIE Ferrara Mickey. MD   CC: Dr. Prentice Medicus

## 2023-09-01 ENCOUNTER — Encounter (HOSPITAL_COMMUNITY): Admission: EM | Disposition: A | Discharge status: Home / Self Care | Payer: Self-pay | Source: Home / Self Care

## 2023-09-01 ENCOUNTER — Observation Stay (HOSPITAL_COMMUNITY): Admitting: Anesthesiology

## 2023-09-01 ENCOUNTER — Other Ambulatory Visit: Payer: Self-pay

## 2023-09-01 ENCOUNTER — Encounter (HOSPITAL_COMMUNITY): Payer: Self-pay | Admitting: Urology

## 2023-09-01 ENCOUNTER — Observation Stay (HOSPITAL_COMMUNITY)

## 2023-09-01 DIAGNOSIS — E66813 Obesity, class 3: Secondary | ICD-10-CM | POA: Diagnosis not present

## 2023-09-01 DIAGNOSIS — N201 Calculus of ureter: Secondary | ICD-10-CM | POA: Diagnosis not present

## 2023-09-01 DIAGNOSIS — Z6841 Body Mass Index (BMI) 40.0 and over, adult: Secondary | ICD-10-CM

## 2023-09-01 HISTORY — PX: CYSTOSCOPY WITH URETEROSCOPY AND STENT PLACEMENT: SHX6377

## 2023-09-01 LAB — CBC
HCT: 35.7 % — ABNORMAL LOW (ref 36.0–46.0)
Hemoglobin: 11.4 g/dL — ABNORMAL LOW (ref 12.0–15.0)
MCH: 28.1 pg (ref 26.0–34.0)
MCHC: 31.9 g/dL (ref 30.0–36.0)
MCV: 87.9 fL (ref 80.0–100.0)
Platelets: 329 K/uL (ref 150–400)
RBC: 4.06 MIL/uL (ref 3.87–5.11)
RDW: 14.1 % (ref 11.5–15.5)
WBC: 20 K/uL — ABNORMAL HIGH (ref 4.0–10.5)
nRBC: 0 % (ref 0.0–0.2)

## 2023-09-01 LAB — BASIC METABOLIC PANEL WITH GFR
Anion gap: 12 (ref 5–15)
BUN: 13 mg/dL (ref 6–20)
CO2: 22 mmol/L (ref 22–32)
Calcium: 8.3 mg/dL — ABNORMAL LOW (ref 8.9–10.3)
Chloride: 100 mmol/L (ref 98–111)
Creatinine, Ser: 1.17 mg/dL — ABNORMAL HIGH (ref 0.44–1.00)
GFR, Estimated: >60 mL/min (ref >60)
Glucose, Bld: 101 mg/dL — ABNORMAL HIGH (ref 70–99)
Potassium: 3.8 mmol/L (ref 3.5–5.1)
Sodium: 134 mmol/L — ABNORMAL LOW (ref 135–145)

## 2023-09-01 SURGERY — CYSTOURETEROSCOPY, WITH STENT INSERTION
Anesthesia: General | Site: Ureter | Laterality: Right

## 2023-09-01 MED ORDER — SODIUM CHLORIDE 0.9 % IR SOLN
Status: DC | PRN
  Administered 2023-09-01: 3000 mL

## 2023-09-01 MED ORDER — ACETAMINOPHEN 500 MG PO TABS
ORAL_TABLET | ORAL | Status: AC
  Filled 2023-09-01: qty 2

## 2023-09-01 MED ORDER — LIDOCAINE HCL (PF) 2 % IJ SOLN
INTRAMUSCULAR | Status: AC
  Filled 2023-09-01: qty 5

## 2023-09-01 MED ORDER — PROPOFOL 10 MG/ML IV BOLUS
INTRAVENOUS | Status: AC
  Filled 2023-09-01: qty 20

## 2023-09-01 MED ORDER — HYDROMORPHONE HCL 2 MG/ML IJ SOLN
INTRAMUSCULAR | Status: AC
  Filled 2023-09-01: qty 1

## 2023-09-01 MED ORDER — LACTATED RINGERS IV SOLN: INTRAVENOUS | Status: DC

## 2023-09-01 MED ORDER — ALBUTEROL SULFATE (2.5 MG/3ML) 0.083% IN NEBU: 2.5000 mg | INHALATION_SOLUTION | Freq: Four times a day (QID) | RESPIRATORY_TRACT | Status: DC | PRN

## 2023-09-01 MED ORDER — ONDANSETRON HCL 4 MG/2ML IJ SOLN
INTRAMUSCULAR | Status: AC
  Filled 2023-09-01: qty 2

## 2023-09-01 MED ORDER — HYDROMORPHONE HCL 1 MG/ML IJ SOLN
INTRAMUSCULAR | Status: DC | PRN
  Administered 2023-09-01: .5 mg via INTRAVENOUS

## 2023-09-01 MED ORDER — MIDAZOLAM HCL 2 MG/2ML IJ SOLN
INTRAMUSCULAR | Status: AC
Start: 2023-09-01 — End: 2023-09-01
  Filled 2023-09-01: qty 2

## 2023-09-01 MED ORDER — CEFAZOLIN SODIUM-DEXTROSE 2-3 GM-%(50ML) IV SOLR
INTRAVENOUS | Status: DC | PRN
Start: 2023-09-01 — End: 2023-09-01
  Administered 2023-09-01: 2 g via INTRAVENOUS

## 2023-09-01 MED ORDER — CEFAZOLIN SODIUM 1 G IJ SOLR
INTRAMUSCULAR | Status: AC
  Filled 2023-09-01: qty 20

## 2023-09-01 MED ORDER — DEXAMETHASONE SODIUM PHOSPHATE 4 MG/ML IJ SOLN
INTRAMUSCULAR | Status: DC | PRN
  Administered 2023-09-01: 10 mg via INTRAVENOUS

## 2023-09-01 MED ORDER — ORAL CARE MOUTH RINSE: 15.0000 mL | Freq: Once | OROMUCOSAL | Status: AC

## 2023-09-01 MED ORDER — PHENYLEPHRINE 80 MCG/ML (10ML) SYRINGE FOR IV PUSH (FOR BLOOD PRESSURE SUPPORT)
PREFILLED_SYRINGE | INTRAVENOUS | Status: AC
  Filled 2023-09-01: qty 10

## 2023-09-01 MED ORDER — DEXMEDETOMIDINE HCL IN NACL 80 MCG/20ML IV SOLN
INTRAVENOUS | Status: DC | PRN
  Administered 2023-09-01: 4 ug via INTRAVENOUS
  Administered 2023-09-01: 8 ug via INTRAVENOUS

## 2023-09-01 MED ORDER — DEXAMETHASONE SODIUM PHOSPHATE 10 MG/ML IJ SOLN
INTRAMUSCULAR | Status: AC
  Filled 2023-09-01: qty 1

## 2023-09-01 MED ORDER — LIDOCAINE HCL (CARDIAC) PF 100 MG/5ML IV SOSY
PREFILLED_SYRINGE | INTRAVENOUS | Status: DC | PRN
  Administered 2023-09-01: 100 mg via INTRAVENOUS

## 2023-09-01 MED ORDER — FENTANYL CITRATE PF 50 MCG/ML IJ SOSY: 25.0000 ug | PREFILLED_SYRINGE | INTRAMUSCULAR | Status: DC | PRN

## 2023-09-01 MED ORDER — CEPHALEXIN 500 MG PO CAPS: 500.0000 mg | ORAL_CAPSULE | Freq: Three times a day (TID) | ORAL | 0 refills | Status: DC

## 2023-09-01 MED ORDER — AMISULPRIDE (ANTIEMETIC) 5 MG/2ML IV SOLN: 10.0000 mg | Freq: Once | INTRAVENOUS | Status: DC | PRN

## 2023-09-01 MED ORDER — OXYCODONE HCL 5 MG PO TABS: 5.0000 mg | ORAL_TABLET | Freq: Once | ORAL | Status: DC | PRN

## 2023-09-01 MED ORDER — OXYCODONE HCL 5 MG/5ML PO SOLN: 5.0000 mg | Freq: Once | ORAL | Status: DC | PRN

## 2023-09-01 MED ORDER — PROPOFOL 10 MG/ML IV BOLUS
INTRAVENOUS | Status: DC | PRN
  Administered 2023-09-01: 200 mg via INTRAVENOUS

## 2023-09-01 MED ORDER — FENTANYL CITRATE (PF) 100 MCG/2ML IJ SOLN
INTRAMUSCULAR | Status: AC
  Filled 2023-09-01: qty 2

## 2023-09-01 MED ORDER — MIDAZOLAM HCL 5 MG/5ML IJ SOLN
INTRAMUSCULAR | Status: DC | PRN
  Administered 2023-09-01: 2 mg via INTRAVENOUS

## 2023-09-01 MED ORDER — CHLORHEXIDINE GLUCONATE 0.12 % MT SOLN
15.0000 mL | Freq: Once | OROMUCOSAL | Status: AC
  Administered 2023-09-01: 15 mL via OROMUCOSAL

## 2023-09-01 MED ORDER — FENTANYL CITRATE (PF) 100 MCG/2ML IJ SOLN
INTRAMUSCULAR | Status: DC | PRN
  Administered 2023-09-01 (×2): 50 ug via INTRAVENOUS

## 2023-09-01 MED ORDER — ACETAMINOPHEN 500 MG PO TABS
1000.0000 mg | ORAL_TABLET | Freq: Once | ORAL | Status: AC
  Administered 2023-09-01: 1000 mg via ORAL

## 2023-09-01 SURGICAL SUPPLY — 18 items
BAG COUNTER SPONGE SURGICOUNT (BAG) IMPLANT
BAG URO CATCHER STRL LF (MISCELLANEOUS) ×1 IMPLANT
BASKET ZERO TIP NITINOL 2.4FR (BASKET) IMPLANT
CATH URETL OPEN END 6FR 70 (CATHETERS) IMPLANT
CLOTH BEACON ORANGE TIMEOUT ST (SAFETY) ×1 IMPLANT
GLOVE SURG LX STRL 7.5 STRW (GLOVE) ×1 IMPLANT
GOWN STRL REUS W/ TWL XL LVL3 (GOWN DISPOSABLE) ×1 IMPLANT
GUIDEWIRE STR DUAL SENSOR (WIRE) ×1 IMPLANT
GUIDEWIRE ZIPWRE .038 STRAIGHT (WIRE) IMPLANT
IV NS 1000ML BAXH (IV SOLUTION) ×1 IMPLANT
KIT TURNOVER KIT A (KITS) ×1 IMPLANT
MANIFOLD NEPTUNE II (INSTRUMENTS) ×1 IMPLANT
PACK CYSTO (CUSTOM PROCEDURE TRAY) ×1 IMPLANT
SHEATH NAVIGATOR HD 12/14X36 (SHEATH) IMPLANT
STENT URET 6FRX24 CONTOUR (STENTS) IMPLANT
TRACTIP FLEXIVA PULS ID 200XHI (Laser) IMPLANT
TUBING CONNECTING 10 (TUBING) ×1 IMPLANT
TUBING UROLOGY SET (TUBING) ×1 IMPLANT

## 2023-09-01 NOTE — Transfer of Care (Signed)
 Immediate Anesthesia Transfer of Care Note  Patient: Brittany Mcmahon  Procedure(s) Performed: CYSTOSCOPY, WITH STENT INSERTION (Right: Ureter)  Patient Location: PACU  Anesthesia Type:General  Level of Consciousness: awake and oriented  Airway & Oxygen Therapy: Patient Spontanous Breathing and Patient connected to face mask oxygen  Post-op Assessment: Report given to RN and Post -op Vital signs reviewed and stable  Post vital signs: Reviewed and stable  Last Vitals:  Vitals Value Taken Time  BP 145/96 09/01/23 14:22  Temp    Pulse 116 09/01/23 14:23  Resp 29 09/01/23 14:23  SpO2 100 % 09/01/23 14:23  Vitals shown include unfiled device data.  Last Pain:  Vitals:   09/01/23 1255  TempSrc: Oral  PainSc: 5          Complications: No notable events documented.

## 2023-09-01 NOTE — Progress Notes (Signed)
 Pt alert and oriented *4. Pt c/o slight dizziness and unsteady to ambulate after procedure. PRN pain med was effective to relief pain on RLQ. Pt voiding well and tolerated fluid and meal. Pt stated that her mother works second shift and will come at midnight to provide her transportation to home. Pt currently resting with call bell in reach. Bed low and locked.

## 2023-09-01 NOTE — Plan of Care (Signed)
   Problem: Coping: Goal: Level of anxiety will decrease Outcome: Progressing   Problem: Pain Managment: Goal: General experience of comfort will improve and/or be controlled Outcome: Progressing

## 2023-09-01 NOTE — Anesthesia Procedure Notes (Signed)
 Procedure Name: LMA Insertion Date/Time: 09/01/2023 1:49 PM  Performed by: Lanning Cena RAMAN, CRNAPre-anesthesia Checklist: Patient identified, Emergency Drugs available, Suction available, Patient being monitored and Timeout performed Patient Re-evaluated:Patient Re-evaluated prior to induction Oxygen Delivery Method: Circle system utilized Preoxygenation: Pre-oxygenation with 100% oxygen Induction Type: IV induction Ventilation: Mask ventilation without difficulty LMA: LMA inserted LMA Size: 4.0 Number of attempts: 1 Placement Confirmation: positive ETCO2 and breath sounds checked- equal and bilateral Tube secured with: Tape Dental Injury: Teeth and Oropharynx as per pre-operative assessment  Comments: No complications

## 2023-09-01 NOTE — Discharge Summary (Signed)
  Date of admission: 08/31/2023  Date of discharge: 09/01/2023  Admission diagnosis: Right ureteral calculus  Discharge diagnosis: Right ureteral calculus  Secondary diagnoses: Asthma  History and Physical: For full details, please see admission history and physical. Briefly, Brittany Mcmahon is a 23 y.o. year old patient with a 3 mm right ureteral stone of the proximal ureter.  She presented to the emergency department 08/30/2023 and was diagnosed with a 3 mm right ureteral stone.  She was able to be discharged home.  Hospital Course: She presented back to the emergency department on 08/31/2023 with uncontrolled pain.  She was admitted for IV pain control.  Her pain persisted and on the morning of 09/01/2023, based on her uncontrolled pain as well as her rising white blood count, the decision was made to proceed with right ureteral stent placement.  She underwent this procedure that afternoon and was able to be subsequently discharged home.  Laboratory values:  Recent Labs    08/30/23 1000 08/31/23 1653 09/01/23 0314  HGB 14.0 10.8* 11.4*  HCT 44.9 34.2* 35.7*   Recent Labs    08/31/23 1653 09/01/23 0314  CREATININE 1.09* 1.17*    Disposition: Home  Discharge instruction: The patient was instructed to be ambulatory but told to refrain from heavy lifting, strenuous activity, or driving. \  Discharge medications:  Allergies as of 09/01/2023   No Known Allergies      Medication List     TAKE these medications    albuterol  108 (90 Base) MCG/ACT inhaler Commonly known as: VENTOLIN  HFA Inhale 1-2 puffs into the lungs every 6 (six) hours as needed for wheezing or shortness of breath.   levonorgestrel  20 MCG/DAY Iud Commonly known as: MIRENA  1 each by Intrauterine route once.   naproxen  500 MG tablet Commonly known as: NAPROSYN  Take 1 tablet (500 mg total) by mouth 2 (two) times daily.   ondansetron  4 MG disintegrating tablet Commonly known as: ZOFRAN -ODT Take 1 tablet (4 mg  total) by mouth every 8 (eight) hours as needed for nausea or vomiting.   oxyCODONE  5 MG immediate release tablet Commonly known as: Oxy IR/ROXICODONE  Take 1 tablet (5 mg total) by mouth every 6 (six) hours as needed for severe pain (pain score 7-10).   tamsulosin  0.4 MG Caps capsule Commonly known as: FLOMAX  Take 1 capsule (0.4 mg total) by mouth daily.        Followup:   Follow-up Information     [facility] Follow up.   Why: Will call to schedule. Contact information: [address] [place] [address] [place]  [address] [phone]

## 2023-09-01 NOTE — Progress Notes (Signed)
 Spoke with on-call provider, Jacqulyn Bound, MD, ok to hold discharge until morning as patient's mom works late shift and is providing transportation and also patient still having quite a bit of pain and is a little unsteady after her procedure today.

## 2023-09-01 NOTE — Progress Notes (Signed)
 Patient ID: Brittany Mcmahon, female   DOB: 19-Jul-2000, 22 y.o.   MRN: [id number]  Pt still with uncontrolled pain.  WBC increasing.  Will plan for cystoscopy and right ureteral stent later today.  I discussed the potential benefits and risks of the procedure, side effects of the proposed treatment, the likelihood of the patient achieving the goals of the procedure, and any potential problems that might occur during the procedure or recuperation. She gives informed consent.

## 2023-09-01 NOTE — Progress Notes (Signed)
 MEWS Progress Note  Patient Details Name: Brittany Mcmahon MRN: [id number] DOB: 2000-05-09 Today's Date: 09/01/2023   MEWS Flowsheet Documentation:  Assess: MEWS Score Temp: 99.8 F (37.7 C) BP: (!) 153/100 MAP (mmHg): 113 Pulse Rate: (!) 124 Resp: 18 Level of Consciousness: Alert SpO2: 96 % O2 Device: Room Air Assess: MEWS Score MEWS Temp: 0 MEWS Systolic: 0 MEWS Pulse: 2 MEWS RR: 0 MEWS LOC: 0 MEWS Score: 2 MEWS Score Color: Yellow Assess: SIRS CRITERIA SIRS Temperature : 0 SIRS Respirations : 0 SIRS Pulse: 1 SIRS WBC: 0 SIRS Score Sum : 1 Assess: if the MEWS score is Yellow or Red Were vital signs accurate and taken at a resting state?: Yes Does the patient meet 2 or more of the SIRS criteria?: Yes Does the patient have a confirmed or suspected source of infection?: No MEWS guidelines implemented : Yes, yellow Treat MEWS Interventions: Considered administering scheduled or prn medications/treatments as ordered Take Vital Signs Increase Vital Sign Frequency : Yellow: Q2hr x1, continue Q4hrs until patient remains green for 12hrs Escalate MEWS: Escalate: Yellow: Discuss with charge nurse and consider notifying provider and/or RRT Notify: Charge Nurse/RN Name of Charge Nurse/RN Notified: Medford Pouch, RN (this is my patient) Provider Notification Provider Name/Title: Alan Nine Date Provider Notified: 09/01/23 Time Provider Notified: [id number] Method of Notification: Page Notification Reason: Other (Comment) (per mews protocol) Provider response: No new orders Date of Provider Response: 09/01/23 Time of Provider Response: 0043 Notify: Rapid Response Name of Rapid Response RN Notified: not necessary at this time      Tawni Pouch 09/01/2023, 12:47 AM

## 2023-09-01 NOTE — Anesthesia Preprocedure Evaluation (Addendum)
 Anesthesia Evaluation  Patient identified by MRN, date of birth, ID band Patient awake    Reviewed: Allergy & Precautions, NPO status , Patient's Chart, lab work & pertinent test results  Airway Mallampati: III  TM Distance: >3 FB Neck ROM: Full    Dental no notable dental hx. (+) Teeth Intact, Dental Advisory Given   Pulmonary asthma    Pulmonary exam normal breath sounds clear to auscultation       Cardiovascular negative cardio ROS Normal cardiovascular exam Rhythm:Regular Rate:Normal     Neuro/Psych negative neurological ROS  negative psych ROS   GI/Hepatic negative GI ROS, Neg liver ROS,,,  Endo/Other    Class 3 obesity (BMI 42)  Renal/GU negative Renal ROS  negative genitourinary   Musculoskeletal negative musculoskeletal ROS (+)    Abdominal   Peds  Hematology negative hematology ROS (+)   Anesthesia Other Findings   Reproductive/Obstetrics                              Anesthesia Physical Anesthesia Plan  ASA: 3  Anesthesia Plan: General   Post-op Pain Management: Tylenol  PO (pre-op)*   Induction: Intravenous  PONV Risk Score and Plan: 3 and Ondansetron , Dexamethasone  and Midazolam   Airway Management Planned: LMA  Additional Equipment:   Intra-op Plan:   Post-operative Plan: Extubation in OR  Informed Consent: I have reviewed the patients History and Physical, chart, labs and discussed the procedure including the risks, benefits and alternatives for the proposed anesthesia with the patient or authorized representative who has indicated his/her understanding and acceptance.     Dental advisory given  Plan Discussed with: CRNA  Anesthesia Plan Comments:          Anesthesia Quick Evaluation

## 2023-09-01 NOTE — Plan of Care (Signed)
   Problem: Activity: Goal: Risk for activity intolerance will decrease Outcome: Progressing   Problem: Nutrition: Goal: Adequate nutrition will be maintained Outcome: Progressing   Problem: Pain Managment: Goal: General experience of comfort will improve and/or be controlled Outcome: Progressing   Problem: Safety: Goal: Ability to remain free from injury will improve Outcome: Progressing

## 2023-09-01 NOTE — Progress Notes (Signed)
   09/01/23 1006  TOC Brief Assessment  Insurance and Status Reviewed  Patient has primary care physician Yes  Home environment has been reviewed home with family  Prior level of function: independent  Prior/Current Home Services No current home services  Social Drivers of Health Review SDOH reviewed no interventions necessary  Readmission risk has been reviewed Yes  Transition of care needs no transition of care needs at this time

## 2023-09-01 NOTE — Discharge Instructions (Signed)

## 2023-09-01 NOTE — Op Note (Signed)
 Preoperative diagnosis:  Right ureteral calculus   Postoperative diagnosis:  Right ureteral calculus   Procedure:  Cystoscopy Right ureteral stent placement (6 x 24-no string)  Surgeon: Gretel CANDIE Ferrara, Mickey. M.D.  Anesthesia: General  Complications: None  EBL: Minimal  Specimens: None  Indication: Brittany Mcmahon is a 23 y.o. patient with right ureteral obstruction secondary to a 3 mm right proximal ureteral calculus with uncontrolled pain. After reviewing the management options for treatment, she elected to proceed with the above surgical procedure(s). We have discussed the potential benefits and risks of the procedure, side effects of the proposed treatment, the likelihood of the patient achieving the goals of the procedure, and any potential problems that might occur during the procedure or recuperation. Informed consent has been obtained.  Description of procedure:  The patient was taken to the operating room and general anesthesia was induced.  The patient was placed in the dorsal lithotomy position, prepped and draped in the usual sterile fashion, and preoperative antibiotics were administered. A preoperative time-out was performed.   Cystourethroscopy was performed.  The patient's urethra was examined and was unremarkable. The bladder was then systematically examined in its entirety. There was no evidence for any bladder tumors, stones, or other mucosal pathology.    Attention then turned to the right ureteral orifice and the patient's indwelling ureteral stent was identified and brought out to the urethral meatus with the flexible graspers.  A 0.38 sensor guidewire was then advanced up the right ureter into the renal pelvis under fluoroscopic guidance.  The wire was then backloaded through the cystoscope and a ureteral stent was advance over the wire using Seldinger technique.  The stent was positioned appropriately under fluoroscopic and cystoscopic guidance.  The wire was then  removed with an adequate stent curl noted in the renal pelvis as well as in the bladder.  The bladder was then emptied and the procedure ended.  The patient appeared to tolerate the procedure well and without complications.  The patient was able to be awakened and transferred to the recovery unit in satisfactory condition.    Gretel CANDIE Ferrara Teddie MD

## 2023-09-02 ENCOUNTER — Encounter (HOSPITAL_COMMUNITY): Payer: Self-pay | Admitting: Urology

## 2023-09-02 LAB — URINE CULTURE

## 2023-09-02 NOTE — Plan of Care (Signed)
   Problem: Coping: Goal: Level of anxiety will decrease Outcome: Progressing   Problem: Pain Managment: Goal: General experience of comfort will improve and/or be controlled Outcome: Progressing

## 2023-09-02 NOTE — Progress Notes (Signed)
 Discharge paperwork printed and reviewed with the pt. Pt verbalized understanding and no concerns made. Pt aware to pick up prescription at [facility]. Mother present at bedside and will provide transportation.

## 2023-09-02 NOTE — Progress Notes (Signed)
  [facility] [address] [place], [place]  [address] Phone:  [phone]  September 02, 2023   To Whom It May Concern:  Brittany Mcmahon was admitted to [facility] on August 31, 2023 and discharged September 02, 2023.  Delon ALEC Birmingham RN

## 2023-09-03 LAB — NASOPHARYNGEAL CULTURE
Culture: NOT DETECTED
Special Requests: NORMAL

## 2023-09-05 LAB — CULTURE, BLOOD (ROUTINE X 2)
Culture: NO GROWTH
Culture: NO GROWTH

## 2023-09-05 NOTE — Anesthesia Postprocedure Evaluation (Signed)
 Anesthesia Post Note  Patient: Brittany Mcmahon  Procedure(s) Performed: CYSTOSCOPY, WITH STENT INSERTION (Right: Ureter)     Patient location during evaluation: PACU Anesthesia Type: General Level of consciousness: awake and alert Pain management: pain level controlled Vital Signs Assessment: post-procedure vital signs reviewed and stable Respiratory status: spontaneous breathing, nonlabored ventilation, respiratory function stable and patient connected to nasal cannula oxygen Cardiovascular status: blood pressure returned to baseline and stable Postop Assessment: no apparent nausea or vomiting Anesthetic complications: no   No notable events documented.  Last Vitals:  Vitals:   09/01/23 1955 09/02/23 0602  BP: (!) 156/107 (!) 159/120  Pulse: 89 81  Resp: 16 18  Temp: 36.6 C 36.8 C  SpO2: 98% 100%    Last Pain:  Vitals:   09/02/23 0731  TempSrc:   PainSc: 1    Pain Goal: Patients Stated Pain Goal: 2 (09/02/23 0600)                 Atley Scarboro L Margurete Guaman

## 2023-09-20 ENCOUNTER — Other Ambulatory Visit: Payer: Self-pay | Admitting: Urology

## 2023-10-10 NOTE — Patient Instructions (Signed)
 SURGICAL WAITING ROOM VISITATION  Patients having surgery or a procedure may have no more than 2 support people in the waiting area - these visitors may rotate.    Children under the age of 80 must have an adult with them who is not the patient.  Visitors with respiratory illnesses are discouraged from visiting and should remain at home.  If the patient needs to stay at the hospital during part of their recovery, the visitor guidelines for inpatient rooms apply. Pre-op nurse will coordinate an appropriate time for 1 support person to accompany patient in pre-op.  This support person may not rotate.    Please refer to the [facility] website for the visitor guidelines for Inpatients (after your surgery is over and you are in a regular room).       Your procedure is scheduled on: 10/24/23   Report to [facility] Main Entrance    Report to admitting at 7:45 AM   Call this number if you have problems the morning of surgery [phone]   Do not eat food or drink liquids :After Midnight.but may have sips of water with meds.   Oral Hygiene is also important to reduce your risk of infection.                                    Remember - BRUSH YOUR TEETH THE MORNING OF SURGERY WITH YOUR REGULAR TOOTHPASTE   Stop all vitamins and herbal supplements 7 days before surgery.   Take these medicines the morning of surgery with A SIP OF WATER: oxycodone  if needed, Zofran  if needed, Tamsulosin , inhaler             You may not have any metal on your body including hair pins, jewelry, and body piercing             Do not wear make-up, lotions, powders, perfumes/cologne, or deodorant  Do not wear nail polish including gel and S&S, artificial/acrylic nails, or any other type of covering on natural nails including finger and toenails. If you have artificial nails, gel coating, etc. that needs to be removed by a nail salon please have this removed prior to surgery or surgery may need to be  canceled/ delayed if the surgeon/ anesthesia feels like they are unable to be safely monitored.   Do not shave  48 hours prior to surgery.    Do not bring valuables to the hospital. [facility] IS NOT             RESPONSIBLE   FOR VALUABLES.   Contacts, glasses, dentures or bridgework may not be worn into surgery.  DO NOT BRING YOUR HOME MEDICATIONS TO THE HOSPITAL. PHARMACY WILL DISPENSE MEDICATIONS LISTED ON YOUR MEDICATION LIST TO YOU DURING YOUR ADMISSION IN THE HOSPITAL!    Patients discharged on the day of surgery will not be allowed to drive home.  Someone NEEDS to stay with you for the first 24 hours after anesthesia.   Special Instructions: Bring a copy of your healthcare power of attorney and living will documents the day of surgery if you haven't scanned them before.              Please read over the following fact sheets you were given: IF YOU HAVE QUESTIONS ABOUT YOUR PRE-OP INSTRUCTIONS PLEASE CALL [phone] [place]   If you received a COVID test during your pre-op visit  it  is requested that you wear a mask when out in public, stay away from anyone that may not be feeling well and notify your surgeon if you develop symptoms. If you test positive for Covid or have been in contact with anyone that has tested positive in the last 10 days please notify you surgeon.    [facility] - Preparing for Surgery Before surgery, you can play an important role.  Because skin is not sterile, your skin needs to be as free of germs as possible.  You can reduce the number of germs on your skin by washing with CHG (chlorahexidine gluconate) soap before surgery.  CHG is an antiseptic cleaner which kills germs and bonds with the skin to continue killing germs even after washing. Please DO NOT use if you have an allergy to CHG or antibacterial soaps.  If your skin becomes reddened/irritated stop using the CHG and inform your nurse when you arrive at Short Stay. Do not shave (including legs and  underarms) for at least 48 hours prior to the first CHG shower.  You may shave your face/neck.  Please follow these instructions carefully:  1.  Shower with CHG Soap the night before surgery and the  morning of surgery.  2.  If you choose to wash your hair, wash your hair first as usual with your normal  shampoo.  3.  After you shampoo, rinse your hair and body thoroughly to remove the shampoo.                             4.  Use CHG as you would any other liquid soap.  You can apply chg directly to the skin and wash.  Gently with a scrungie or clean washcloth.  5.  Apply the CHG Soap to your body ONLY FROM THE NECK DOWN.   Do   not use on face/ open                           Wound or open sores. Avoid contact with eyes, ears mouth and   genitals (private parts).                       Wash face,  Genitals (private parts) with your normal soap.             6.  Wash thoroughly, paying special attention to the area where your    surgery  will be performed.  7.  Thoroughly rinse your body with warm water from the neck down.  8.  DO NOT shower/wash with your normal soap after using and rinsing off the CHG Soap.                9.  Pat yourself dry with a clean towel.            10.  Wear clean pajamas.            11.  Place clean sheets on your bed the night of your first shower and do not  sleep with pets. Day of Surgery : Do not apply any lotions/deodorants the morning of surgery.  Please wear clean clothes to the hospital/surgery center.  FAILURE TO FOLLOW THESE INSTRUCTIONS MAY RESULT IN THE CANCELLATION OF YOUR SURGERY  PATIENT SIGNATURE_________________________________  NURSE SIGNATURE__________________________________  ________________________________________________________________________

## 2023-10-10 NOTE — Progress Notes (Signed)
 COVID Vaccine received:  []  No [x]  Yes Date of any COVID positive Test in last 90 days: no PCP - Arthor Harris MD Cardiologist - n/a  Chest x-ray -  EKG -   Stress Test -  ECHO -  Cardiac Cath -   Bowel Prep - [x]  No  []   Yes ______  Pacemaker / ICD device [x]  No []  Yes   Spinal Cord Stimulator:[x]  No []  Yes       History of Sleep Apnea? [x]  No []  Yes   CPAP used?- [x]  No []  Yes    Does the patient monitor blood sugar?          [x]  No []  Yes  []  N/A  Patient has: [x]  NO Hx DM   []  Pre-DM                 []  DM1  []   DM2 Does patient have a [employer] or [employer]? []  No []  Yes   Fasting Blood Sugar Ranges-  Checks Blood Sugar _____ times a day  GLP1 agonist / usual dose - no GLP1 instructions:  SGLT-2 inhibitors / usual dose - no SGLT-2 instructions:   Blood Thinner / Instructions:no Aspirin Instructions:no  Comments:   Activity level: Patient is able  to climb a flight of stairs without difficulty; [x]  No CP  [x]  No SOB,   Patient can perform ADLs without assistance.   Anesthesia review:   Patient denies shortness of breath, fever, cough and chest pain at PAT appointment.  Patient verbalized understanding and agreement to the Pre-Surgical Instructions that were given to them at this PAT appointment. Patient was also educated of the need to review these PAT instructions again prior to his/her surgery.I reviewed the appropriate phone numbers to call if they have any and questions or concerns.

## 2023-10-12 ENCOUNTER — Other Ambulatory Visit: Payer: Self-pay

## 2023-10-12 ENCOUNTER — Encounter (HOSPITAL_COMMUNITY): Payer: Self-pay

## 2023-10-12 ENCOUNTER — Encounter (HOSPITAL_COMMUNITY)
Admission: RE | Admit: 2023-10-12 | Discharge: 2023-10-12 | Disposition: A | Discharge status: Home / Self Care | Source: Ambulatory Visit | Attending: Urology | Admitting: Urology

## 2023-10-12 VITALS — BP 156/100 | HR 89 | Temp 98.4°F | Resp 18 | Ht 65.0 in | Wt 293.0 lb

## 2023-10-12 DIAGNOSIS — Z01818 Encounter for other preprocedural examination: Secondary | ICD-10-CM

## 2023-10-12 DIAGNOSIS — Z01812 Encounter for preprocedural laboratory examination: Secondary | ICD-10-CM | POA: Diagnosis present

## 2023-10-12 HISTORY — DX: Personal history of urinary calculi: Z87.442

## 2023-10-12 HISTORY — DX: Depression, unspecified: F32.A

## 2023-10-12 HISTORY — DX: Anxiety disorder, unspecified: F41.9

## 2023-10-12 LAB — CBC
HCT: 38.5 % (ref 36.0–46.0)
Hemoglobin: 11.9 g/dL — ABNORMAL LOW (ref 12.0–15.0)
MCH: 27.4 pg (ref 26.0–34.0)
MCHC: 30.9 g/dL (ref 30.0–36.0)
MCV: 88.5 fL (ref 80.0–100.0)
Platelets: 373 K/uL (ref 150–400)
RBC: 4.35 MIL/uL (ref 3.87–5.11)
RDW: 14 % (ref 11.5–15.5)
WBC: 12.7 K/uL — ABNORMAL HIGH (ref 4.0–10.5)
nRBC: 0 % (ref 0.0–0.2)

## 2023-10-21 ENCOUNTER — Encounter (HOSPITAL_COMMUNITY): Payer: Self-pay | Admitting: Urology

## 2023-10-21 NOTE — H&P (Signed)
 Brittany Mcmahon         MRN: [id number]  PRIMARY CARE:  Arthor Harris    PRIMARY CARE FAX:  [phone]    REFERRING:  Arthor Harris    PROVIDER:  Gretel Ferrara, M.D.  DOB: 2001/01/01, 23 year old Female  TREATING:  Ubaldo Eagles, NP  SSN:   LOCATION:  [facility] [phone]    CC/HPI: 09/14/2023: Patient is a 23 year old female seen today for hospital follow-up. She was recently diagnosed on 7/29 with a 3 mm right proximal ureteral calculi. Due to the refractory pain, low-grade fevers and increasing white blood cell count she was admitted and taken for ureteral stent placement by Dr. Ferrara on 7/31. She was discharged in appropriate condition the same day. Returns for repeat evaluation and assessment in anticipation of definitive ureteroscopy which is already in the process of being scheduled. Hospital blood cultures were negative, urine culture showed mixed growth.   Past medical history significant for morbid obesity, adjustment disorder, irregular menstruation. She has no prior surgical or urological history.   Today doing well. Pain significantly more controlled at this point. She is mostly complaining of expected irritative voiding symptoms including frequency and urgency, some hesitancy and cramping on the right side with voiding. Has not had significant dysuria or gross hematuria. Denies any recent fevers or chills, nausea/vomiting.     ALLERGIES: None   MEDICATIONS: Ondansetron  4 MG Tablet Disintegrating     GU PSH: No GU PSH    NON-GU PSH: No Non-GU PSH        GU PMH: Renal calculus    NON-GU PMH: Adjustment disorder, unspecified Allergic rhinitis, unspecified Asthma Obesity    FAMILY HISTORY: No Family History    SOCIAL HISTORY: Marital Status: Single Preferred Language: English; Race: Black or African American Current Smoking Status: Patient has never smoked.  <DIV'  Tobacco Use Assessment Completed:  Used Tobacco in last 30 days? Has never  drank.  Drinks 3 caffeinated drinks per day. Patient's occupation Radio producer. </DIV'   REVIEW OF SYSTEMS:    GU Review Female:  Patient reports frequent urination, get up at night to urinate, and stream starts and stops. Patient denies hard to postpone urination, burning /pain with urination, leakage of urine, trouble starting your stream, have to strain to urinate, and being pregnant.   Gastrointestinal (Upper):  Patient denies nausea, vomiting, and indigestion/ heartburn.   Gastrointestinal (Lower):  Patient denies diarrhea and constipation.   Constitutional:  Patient reports night sweats. Patient denies fever, weight loss, and fatigue.   Skin:  Patient denies skin rash/ lesion and itching.   Eyes:  Patient denies blurred vision and double vision.   Ears/ Nose/ Throat:  Patient reports sinus problems. Patient denies sore throat.   Hematologic/Lymphatic:  Patient denies swollen glands and easy bruising.   Cardiovascular:  Patient denies chest pains and leg swelling.   Respiratory:  Patient denies cough and shortness of breath.   Endocrine:  Patient denies excessive thirst.   Musculoskeletal:  Patient denies back pain and joint pain.   Neurological:  Patient denies headaches and dizziness.   Psychologic:  Patient reports anxiety. Patient denies depression.   VITAL SIGNS:      09/14/2023 10:00 AM    BP 145/102 mmHg    Pulse 99 /min    Temperature 98.1 F / 36.7 C    MULTI-SYSTEM PHYSICAL EXAMINATION:     Constitutional: Well-nourished. No  physical deformities. Normally developed. Good grooming.    Neck: Neck symmetrical, not swollen. Normal tracheal position.    Respiratory: No labored breathing, no use of accessory muscles.     Cardiovascular: Normal temperature, normal extremity pulses, no swelling, no varicosities.    Skin: No paleness, no jaundice, no cyanosis. No lesion, no ulcer, no rash.    Neurologic / Psychiatric: Oriented to time, oriented to place, oriented to  person. No depression, no anxiety, no agitation.    Gastrointestinal: Obese abdomen. No mass, no tenderness, no rigidity.     Musculoskeletal: Normal gait and station of head and neck.          Complexity of Data:   Source Of History:  Patient, Medical Record Summary  Lab Test Review:  BMP, CBC with Diff, CMP  Records Review:  Previous Doctor Records, Previous Hospital Records  Urine Test Review:  Urinalysis, Urine Culture  X-Ray Review: C.T. Abdomen/Pelvis: Reviewed Films. Reviewed Report.      09/14/23  Urinalysis  Urine Appearance Cloudy   Urine Color Amber   Urine Glucose Neg mg/dL  Urine Bilirubin Neg mg/dL  Urine Ketones Neg mg/dL  Urine Specific Gravity 1.020   Urine Blood 3+ ery/uL  Urine pH 6.5   Urine Protein 2+ mg/dL  Urine Urobilinogen 0.2 mg/dL  Urine Nitrites Neg   Urine Leukocyte Esterase 3+ leu/uL  Urine WBC/hpf >60/hpf   Urine RBC/hpf 20 - 40/hpf   Urine Epithelial Cells 0 - 5/hpf   Urine Bacteria Many (>50/hpf)   Urine Mucous Present   Urine Yeast NS (Not Seen)   Urine Trichomonas Not Present   Urine Cystals NS (Not Seen)   Urine Casts NS (Not Seen)   Urine Sperm Not Present    Notes:  EXAM:  CT ABDOMEN AND PELVIS WITH CONTRAST   TECHNIQUE:  Multidetector CT imaging of the abdomen and pelvis was performed  using the standard protocol following bolus administration of  intravenous contrast.   RADIATION DOSE REDUCTION: This exam was performed according to the  departmental dose-optimization program which includes automated  exposure control, adjustment of the mA and/or kV according to  patient size and/or use of iterative reconstruction technique.   CONTRAST: OMNIPAQUE  IOHEXOL  300 MG/ML SOLN   COMPARISON: CT abdomen and pelvis dated 10/02/2022   FINDINGS:  Lower chest: No focal consolidation or pulmonary nodule in the lung  bases. No pleural effusion or pneumothorax demonstrated. Partially  imaged heart size is normal.    Hepatobiliary: No focal hepatic lesions. No intra or extrahepatic  biliary ductal dilation. Normal gallbladder.   Pancreas: No focal lesions or main ductal dilation.   Spleen: Normal in size without focal abnormality.   Adrenals/Urinary Tract: No adrenal nodules. Asymmetric  hypoenhancement of the right kidney, which demonstrates mild  hydronephrosis upstream of a 3 mm proximal ureteral stone.  Additional stones within lower pole calyceal diverticuli, increased  compared to 10/02/2022. No left hydronephrosis or calculi. No focal  bladder wall thickening.   Stomach/Bowel: Normal appearance of the stomach. No evidence of  bowel wall thickening, distention, or inflammatory changes. Normal  appendix.   Vascular/Lymphatic: No significant vascular findings are present. No  enlarged abdominal or pelvic lymph nodes.   Reproductive: Appropriately positioned intrauterine device. No  adnexal masses.   Other: No free fluid, fluid collection, or free air.   Musculoskeletal: No acute or abnormal lytic or blastic osseous  lesions.   IMPRESSION:  1. Obstructing 3 mm proximal right ureteral stone with mild  hydronephrosis.  2. Additional stones within right lower pole calyceal diverticuli,  increased from 10/02/2022.    Electronically Signed  By: Limin Xu M.D.  On: 08/30/2023 13:28     PROCEDURES:    Visit Complexity - G2211      Urinalysis w/Scope  Dipstick Dipstick Cont'd Micro  Color: Amber Bilirubin: Neg mg/dL WBC/hpf: >39/yeq  Appearance: Cloudy Ketones: Neg mg/dL RBC/hpf: 20 - 59/yeq  Specific Gravity: 1.020 Blood: 3+ ery/uL Bacteria: Many (>50/hpf)  pH: 6.5 Protein: 2+ mg/dL Cystals: NS (Not Seen)  Glucose: Neg mg/dL Urobilinogen: 0.2 mg/dL Casts: NS (Not Seen)   Nitrites: Neg Trichomonas: Not Present   Leukocyte Esterase: 3+ leu/uL Mucous: Present    Epithelial Cells: 0 - 5/hpf    Yeast: NS (Not Seen)    Sperm: Not Present    ASSESSMENT:     ICD-10 Details   1 GU:  Ureteral calculus - N20.1 Right, Acute, Complicated Injury  2  Renal colic - N23 Right, Acute, Complicated Injury, Improving   PLAN:   Medications  New Meds: Tamsulosin  HCl 0.4 MG Capsule 1 capsule PO Daily #30 1 Refill(s)     Pharmacy Name:  [id number]    Address:  [address]     [place], [place] [address]    Phone:  [phone]    Fax:  [phone]   Stop Meds: Cephalexin  500 MG Capsule 1 capsule PO TID  Discontinue: 09/14/2023 - Reason: The medication cycle was completed.  Tamsulosin  HCl 0.4 MG Capsule  Discontinue: 09/14/2023 - Reason: The medication cycle was completed.  Naproxen  500 MG Tablet  Discontinue: 09/14/2023 - Reason: The medication cycle was completed.  oxyCODONE  HCl 5 MG Tablet  Discontinue: 09/14/2023 - Reason: The medication cycle was completed.    Orders  Labs Urine Culture  Schedule  Return Visit/Planned Activity: Next Available Appointment - Follow up MD, Schedule Surgery  Document  Letter(s):  Created for Patient: Clinical Summary   Notes:  Patient doing well, tolerating her stent appropriately without interval recurrence of colic pain. I am going to have her restart tamsulosin  to down regulate some of the expected irritative stent symptoms. Liberal hydration also encouraged, she can take over-the-counter anti-inflammatories such as ibuprofen  or Tylenol  to manage pain/discomfort. Stool softeners recommended to prevent any type of constipation as well.   UA sent for culture today and upon review if indicated appropriate antimicrobial treatment will be prescribed. It looks like Dr. Griselda scheduler has attempted to contact the patient in order to set up next available ureteroscopy. I will have her check with the front desk to make sure we have the correct contact information and I will send a message to Zona in an effort to reach back out to her to schedule.   For ureteroscopy I described the risks which include heart attack,  stroke, pulmonary embolus, death, bleeding, infection, damage to contiguous structures, positioning injury, ureteral stricture, ureteral avulsion, ureteral injury, need for ureteral stent, inability to perform ureteroscopy, need for an interval procedure, inability to clear stone burden, stent discomfort and pain.

## 2023-10-24 ENCOUNTER — Encounter (HOSPITAL_COMMUNITY): Payer: Self-pay | Admitting: Urology

## 2023-10-24 ENCOUNTER — Ambulatory Visit (HOSPITAL_BASED_OUTPATIENT_CLINIC_OR_DEPARTMENT_OTHER): Payer: Self-pay | Admitting: Anesthesiology

## 2023-10-24 ENCOUNTER — Ambulatory Visit (HOSPITAL_COMMUNITY)
Admission: RE | Admit: 2023-10-24 | Discharge: 2023-10-24 | Disposition: A | Discharge status: Home / Self Care | Source: Ambulatory Visit | Attending: Urology | Admitting: Urology

## 2023-10-24 ENCOUNTER — Encounter (HOSPITAL_COMMUNITY)
Admission: RE | Disposition: A | Discharge status: Home / Self Care | Payer: Self-pay | Source: Ambulatory Visit | Attending: Urology

## 2023-10-24 ENCOUNTER — Ambulatory Visit (HOSPITAL_COMMUNITY)

## 2023-10-24 ENCOUNTER — Other Ambulatory Visit: Payer: Self-pay

## 2023-10-24 ENCOUNTER — Ambulatory Visit (HOSPITAL_COMMUNITY): Payer: Self-pay | Admitting: Anesthesiology

## 2023-10-24 DIAGNOSIS — N200 Calculus of kidney: Secondary | ICD-10-CM

## 2023-10-24 DIAGNOSIS — N201 Calculus of ureter: Secondary | ICD-10-CM | POA: Diagnosis present

## 2023-10-24 DIAGNOSIS — Z6841 Body Mass Index (BMI) 40.0 and over, adult: Secondary | ICD-10-CM | POA: Insufficient documentation

## 2023-10-24 DIAGNOSIS — E6689 Other obesity not elsewhere classified: Secondary | ICD-10-CM | POA: Insufficient documentation

## 2023-10-24 HISTORY — PX: CYSTOSCOPY/URETEROSCOPY/HOLMIUM LASER/STENT PLACEMENT: SHX6546

## 2023-10-24 LAB — POCT PREGNANCY, URINE: Preg Test, Ur: NEGATIVE

## 2023-10-24 SURGERY — CYSTOSCOPY/URETEROSCOPY/HOLMIUM LASER/STENT PLACEMENT
Anesthesia: General | Laterality: Right

## 2023-10-24 MED ORDER — FENTANYL CITRATE (PF) 100 MCG/2ML IJ SOLN
INTRAMUSCULAR | Status: AC
  Filled 2023-10-24: qty 2

## 2023-10-24 MED ORDER — MIDAZOLAM HCL 5 MG/5ML IJ SOLN
INTRAMUSCULAR | Status: DC | PRN
  Administered 2023-10-24: 2 mg via INTRAVENOUS

## 2023-10-24 MED ORDER — PROPOFOL 10 MG/ML IV BOLUS
INTRAVENOUS | Status: DC | PRN
  Administered 2023-10-24: 200 mg via INTRAVENOUS

## 2023-10-24 MED ORDER — ACETAMINOPHEN 10 MG/ML IV SOLN: 1000.0000 mg | Freq: Once | INTRAVENOUS | Status: DC | PRN

## 2023-10-24 MED ORDER — ONDANSETRON HCL 4 MG/2ML IJ SOLN: 4.0000 mg | Freq: Once | INTRAMUSCULAR | Status: DC | PRN

## 2023-10-24 MED ORDER — IOHEXOL 300 MG/ML  SOLN
INTRAMUSCULAR | Status: DC | PRN
  Administered 2023-10-24: 10 mL

## 2023-10-24 MED ORDER — OXYCODONE HCL 5 MG PO TABS: 5.0000 mg | ORAL_TABLET | Freq: Once | ORAL | Status: DC | PRN

## 2023-10-24 MED ORDER — ORAL CARE MOUTH RINSE: 15.0000 mL | Freq: Once | OROMUCOSAL | Status: AC

## 2023-10-24 MED ORDER — TRAMADOL HCL 50 MG PO TABS: 50.0000 mg | ORAL_TABLET | Freq: Four times a day (QID) | ORAL | 0 refills | Status: AC | PRN

## 2023-10-24 MED ORDER — CEFAZOLIN SODIUM-DEXTROSE 3-4 GM/150ML-% IV SOLN
3.0000 g | INTRAVENOUS | Status: DC
  Filled 2023-10-24: qty 150

## 2023-10-24 MED ORDER — FENTANYL CITRATE (PF) 100 MCG/2ML IJ SOLN
INTRAMUSCULAR | Status: DC | PRN
  Administered 2023-10-24 (×2): 50 ug via INTRAVENOUS

## 2023-10-24 MED ORDER — PROPOFOL 10 MG/ML IV BOLUS
INTRAVENOUS | Status: AC
Start: 2023-10-24 — End: 2023-10-24
  Filled 2023-10-24: qty 20

## 2023-10-24 MED ORDER — CEFDINIR 300 MG PO CAPS: 300.0000 mg | ORAL_CAPSULE | Freq: Two times a day (BID) | ORAL | 0 refills | Status: AC

## 2023-10-24 MED ORDER — FENTANYL CITRATE PF 50 MCG/ML IJ SOSY: 25.0000 ug | PREFILLED_SYRINGE | INTRAMUSCULAR | Status: DC | PRN

## 2023-10-24 MED ORDER — DEXAMETHASONE SODIUM PHOSPHATE 4 MG/ML IJ SOLN
INTRAMUSCULAR | Status: DC | PRN
  Administered 2023-10-24: 10 mg via INTRAVENOUS

## 2023-10-24 MED ORDER — LIDOCAINE HCL (CARDIAC) PF 100 MG/5ML IV SOSY
PREFILLED_SYRINGE | INTRAVENOUS | Status: DC | PRN
  Administered 2023-10-24: 100 mg via INTRAVENOUS

## 2023-10-24 MED ORDER — SODIUM CHLORIDE 0.9 % IR SOLN
Status: DC | PRN
  Administered 2023-10-24: 3000 mL via INTRAVESICAL

## 2023-10-24 MED ORDER — PROPOFOL 10 MG/ML IV BOLUS
INTRAVENOUS | Status: AC
  Filled 2023-10-24: qty 20

## 2023-10-24 MED ORDER — DEXMEDETOMIDINE HCL IN NACL 80 MCG/20ML IV SOLN
INTRAVENOUS | Status: DC | PRN
  Administered 2023-10-24 (×3): 4 ug via INTRAVENOUS

## 2023-10-24 MED ORDER — ONDANSETRON HCL 4 MG/2ML IJ SOLN
INTRAMUSCULAR | Status: DC | PRN
Start: 2023-10-24 — End: 2023-10-24
  Administered 2023-10-24: 4 mg via INTRAVENOUS

## 2023-10-24 MED ORDER — OXYCODONE HCL 5 MG/5ML PO SOLN: 5.0000 mg | Freq: Once | ORAL | Status: DC | PRN

## 2023-10-24 MED ORDER — LACTATED RINGERS IV SOLN: INTRAVENOUS | Status: DC

## 2023-10-24 MED ORDER — MIDAZOLAM HCL 2 MG/2ML IJ SOLN
INTRAMUSCULAR | Status: AC
  Filled 2023-10-24: qty 2

## 2023-10-24 MED ORDER — SODIUM CHLORIDE 0.9 % IV SOLN
2.0000 g | INTRAVENOUS | Status: DC
  Administered 2023-10-24: 2 g via INTRAVENOUS
  Filled 2023-10-24: qty 20

## 2023-10-24 MED ORDER — CHLORHEXIDINE GLUCONATE 0.12 % MT SOLN
15.0000 mL | Freq: Once | OROMUCOSAL | Status: AC
  Administered 2023-10-24: 15 mL via OROMUCOSAL

## 2023-10-24 SURGICAL SUPPLY — 19 items
BAG COUNTER SPONGE SURGICOUNT (BAG) IMPLANT
BAG URO CATCHER STRL LF (MISCELLANEOUS) ×1 IMPLANT
BASKET LASER NITINOL 1.9FR (BASKET) IMPLANT
BASKET ZERO TIP NITINOL 2.4FR (BASKET) IMPLANT
CATH URETL OPEN END 6FR 70 (CATHETERS) IMPLANT
CLOTH BEACON ORANGE TIMEOUT ST (SAFETY) ×1 IMPLANT
GLOVE SURG LX STRL 7.5 STRW (GLOVE) ×1 IMPLANT
GOWN STRL REUS W/ TWL XL LVL3 (GOWN DISPOSABLE) ×1 IMPLANT
GUIDEWIRE STR DUAL SENSOR (WIRE) ×1 IMPLANT
GUIDEWIRE ZIPWRE .038 STRAIGHT (WIRE) IMPLANT
IV NS 1000ML BAXH (IV SOLUTION) ×1 IMPLANT
KIT TURNOVER KIT A (KITS) ×1 IMPLANT
MANIFOLD NEPTUNE II (INSTRUMENTS) ×1 IMPLANT
PACK CYSTO (CUSTOM PROCEDURE TRAY) ×1 IMPLANT
SHEATH NAVIGATOR HD 11/13X28 (SHEATH) IMPLANT
SHEATH NAVIGATOR HD 11/13X36 (SHEATH) IMPLANT
TRACTIP FLEXIVA PULS ID 200XHI (Laser) IMPLANT
TUBING CONNECTING 10 (TUBING) ×1 IMPLANT
TUBING UROLOGY SET (TUBING) ×1 IMPLANT

## 2023-10-24 NOTE — Discharge Instructions (Addendum)
 You may see some blood in the urine and may have some burning with urination for 48-72 hours. You also may notice that you have to urinate more frequently or urgently after your procedure which is normal.  You should call should you develop an inability urinate, fever > 101, persistent nausea and vomiting that prevents you from eating or drinking to stay hydrated.

## 2023-10-24 NOTE — Transfer of Care (Signed)
 Immediate Anesthesia Transfer of Care Note  Patient: Brittany Mcmahon  Procedure(s) Performed: Procedure(s): CYSTOSCOPY/URETEROSCOPY/RETROGRADE PYELOGRAM (Right)  Patient Location: PACU  Anesthesia Type:General  Level of Consciousness:  sedated, patient cooperative and responds to stimulation  Airway & Oxygen Therapy:Patient Spontanous Breathing and Patient connected to face mask oxgen  Post-op Assessment:  Report given to PACU RN and Post -op Vital signs reviewed and stable  Post vital signs:  Reviewed and stable  Last Vitals:  Vitals:   10/24/23 0809 10/24/23 1027  BP: (!) 143/93 (!) 150/96  Pulse: 78 91  Resp: 20 18  Temp: 36.7 C 36.7 C  SpO2: 98% 100%    Complications: No apparent anesthesia complications

## 2023-10-24 NOTE — Anesthesia Preprocedure Evaluation (Addendum)
 Anesthesia Evaluation  Patient identified by MRN, date of birth, ID band Patient awake    Reviewed: Allergy & Precautions, NPO status , Patient's Chart, lab work & pertinent test results, reviewed documented beta blocker date and time   History of Anesthesia Complications Negative for: history of anesthetic complications  Airway Mallampati: II       Dental no notable dental hx.    Pulmonary neg shortness of breath, asthma , neg sleep apnea, neg COPD, neg recent URI   breath sounds clear to auscultation       Cardiovascular negative cardio ROS  Rhythm:Regular Rate:Normal     Neuro/Psych  PSYCHIATRIC DISORDERS Anxiety Depression       GI/Hepatic ,neg GERD  ,,(+) neg Cirrhosis        Endo/Other    Class 4 obesity  Renal/GU Renal disease     Musculoskeletal   Abdominal   Peds  Hematology  (+) Blood dyscrasia, anemia   Anesthesia Other Findings   Reproductive/Obstetrics                              Anesthesia Physical Anesthesia Plan  ASA: 2  Anesthesia Plan: General   Post-op Pain Management:    Induction: Intravenous  PONV Risk Score and Plan: 2 and Ondansetron  and Dexamethasone   Airway Management Planned: LMA  Additional Equipment:   Intra-op Plan:   Post-operative Plan: Extubation in OR  Informed Consent: I have reviewed the patients History and Physical, chart, labs and discussed the procedure including the risks, benefits and alternatives for the proposed anesthesia with the patient or authorized representative who has indicated his/her understanding and acceptance.     Dental advisory given  Plan Discussed with: CRNA  Anesthesia Plan Comments:          Anesthesia Quick Evaluation

## 2023-10-24 NOTE — Interval H&P Note (Signed)
 History and Physical Interval Note:  10/24/2023 8:06 AM  Brittany Mcmahon  has presented today for surgery, with the diagnosis of RIGHT URETERAL CALCULUS.  The various methods of treatment have been discussed with the patient and family. After consideration of risks, benefits and other options for treatment, the patient has consented to  Procedure(s): CYSTOSCOPY/URETEROSCOPY/HOLMIUM LASER/STENT PLACEMENT (Right) as a surgical intervention.  The patient's history has been reviewed, patient examined, no change in status, stable for surgery.  I have reviewed the patient's chart and labs.  Questions were answered to the patient's satisfaction.     [employer]

## 2023-10-24 NOTE — Anesthesia Procedure Notes (Signed)
 Procedure Name: LMA Insertion Date/Time: 10/24/2023 9:38 AM  Performed by: Vincenzo Show, CRNAPre-anesthesia Checklist: Patient identified, Emergency Drugs available, Suction available, Patient being monitored and Timeout performed Patient Re-evaluated:Patient Re-evaluated prior to induction Oxygen Delivery Method: Circle system utilized Preoxygenation: Pre-oxygenation with 100% oxygen Induction Type: IV induction Ventilation: Mask ventilation without difficulty LMA: LMA inserted LMA Size: 4.0 Tube size: 4.0 mm Number of attempts: 1 Placement Confirmation: positive ETCO2 and breath sounds checked- equal and bilateral Tube secured with: Tape Dental Injury: Teeth and Oropharynx as per pre-operative assessment  Comments: Easy pass of LMA, patient is hoarse from previous LMA insertion a month ago.

## 2023-10-24 NOTE — Anesthesia Postprocedure Evaluation (Signed)
 Anesthesia Post Note  Patient: Brittany Mcmahon  Procedure(s) Performed: CYSTOSCOPY/URETEROSCOPY/RETROGRADE PYELOGRAM (Right)     Patient location during evaluation: PACU Anesthesia Type: General Level of consciousness: awake and alert Pain management: pain level controlled Vital Signs Assessment: post-procedure vital signs reviewed and stable Respiratory status: spontaneous breathing, nonlabored ventilation, respiratory function stable and patient connected to nasal cannula oxygen Cardiovascular status: blood pressure returned to baseline and stable Postop Assessment: no apparent nausea or vomiting Anesthetic complications: no   No notable events documented.  Last Vitals:  Vitals:   10/24/23 1100 10/24/23 1106  BP: (!) 137/103 (!) 134/90  Pulse: 86 87  Resp: 18 16  Temp: 36.8 C 36.8 C  SpO2: 98% 98%    Last Pain:  Vitals:   10/24/23 1106  TempSrc:   PainSc: 0-No pain                 Lynwood MARLA Cornea

## 2023-10-24 NOTE — Op Note (Signed)
 Preoperative diagnosis: Right ureteral calculus  Postoperative diagnosis: Right renal calculus  Procedure:  Cystoscopy Right ureteroscopy and stone removal Right retrograde pyelography with interpretation  Surgeon: Gretel CANDIE Renda Mickey. M.D.  Anesthesia: General  Complications: None  Intraoperative findings: Right retrograde pyelography demonstrated no filling defects within the ureter or obvious filling defects within the renal collecting system.  The patient's indwelling stent was moderately encrusted.  EBL: Minimal  Specimens: Right renal calculus  Disposition of specimens: [facility] for stone analysis  Indication: Brittany Mcmahon  is a 23 y.o. patient with urolithiasis.  She had recently presented with a 3 mm proximal right ureteral stone and signs of systemic infection.  She underwent urgent right ureteral stent placement.  She follows up today for definitive stone management.  After reviewing the management options for treatment, they elected to proceed with the above surgical procedure(s). We have discussed the potential benefits and risks of the procedure, side effects of the proposed treatment, the likelihood of the patient achieving the goals of the procedure, and any potential problems that might occur during the procedure or recuperation. Informed consent has been obtained.  Description of procedure:  The patient was taken to the operating room and general anesthesia was induced.  The patient was placed in the dorsal lithotomy position, prepped and draped in the usual sterile fashion, and preoperative antibiotics were administered. A preoperative time-out was performed.   Cystourethroscopy was performed.  The patient's urethra was examined and was normal. The bladder was then systematically examined in its entirety. There was no evidence for any bladder tumors, stones, or other mucosal pathology.    The patient's indwelling right ureteral stent was  identified and brought out to the urethral meatus with flexible graspers.  An attempt to insert a sensor guidewire through the indwelling stent was unsuccessful due to significant encrustation.  The stent was therefore removed.  This was removed without difficulty.  Attention then turned to the right ureteral orifice and a ureteral catheter was used to intubate the ureteral orifice.  Omnipaque  contrast was injected through the ureteral catheter and a retrograde pyelogram was performed with findings as dictated above.  A 0.38 sensor guidewire was then advanced up the right ureter into the renal pelvis under fluoroscopic guidance.  A short 12/14 Fr ureteral access sheath was then advanced over the guide wire into the mid ureter. The digital flexible ureteroscope was then advanced through the access sheath into the ureter next to the guidewire and the calculus was identified and was located in the right interpolar calyx.  A single calculus was identified and was removed with a basket.  Reinspection of the ureter/renal pelvis revealed no remaining visible stones or fragments of significant size.   Due to the significant time that she had been restented, it was felt that she would not require stent placement at the end of the procedure.  The bladder was then emptied and the procedure ended.  The patient appeared to tolerate the procedure well and without complications.  The patient was able to be awakened and transferred to the recovery unit in satisfactory condition.   Gretel CANDIE Renda Teddie MD

## 2023-10-25 ENCOUNTER — Encounter (HOSPITAL_COMMUNITY): Payer: Self-pay | Admitting: Urology

## 2023-11-14 ENCOUNTER — Emergency Department (HOSPITAL_COMMUNITY)
Admission: EM | Admit: 2023-11-14 | Discharge: 2023-11-15 | Disposition: A | Discharge status: Home / Self Care | Attending: Emergency Medicine | Admitting: Emergency Medicine

## 2023-11-14 ENCOUNTER — Emergency Department (HOSPITAL_COMMUNITY)

## 2023-11-14 DIAGNOSIS — J45901 Unspecified asthma with (acute) exacerbation: Secondary | ICD-10-CM | POA: Insufficient documentation

## 2023-11-14 DIAGNOSIS — R000 Tachycardia, unspecified: Secondary | ICD-10-CM | POA: Diagnosis not present

## 2023-11-14 DIAGNOSIS — R0602 Shortness of breath: Secondary | ICD-10-CM | POA: Diagnosis present

## 2023-11-14 MED ORDER — KETOROLAC TROMETHAMINE 30 MG/ML IJ SOLN
30.0000 mg | Freq: Once | INTRAMUSCULAR | Status: AC
  Administered 2023-11-14: 30 mg via INTRAVENOUS
  Filled 2023-11-14: qty 1

## 2023-11-14 MED ORDER — MAGNESIUM SULFATE 2 GM/50ML IV SOLN
2.0000 g | Freq: Once | INTRAVENOUS | Status: AC
  Administered 2023-11-14: 2 g via INTRAVENOUS
  Filled 2023-11-14: qty 50

## 2023-11-14 MED ORDER — METHYLPREDNISOLONE SODIUM SUCC 125 MG IJ SOLR
125.0000 mg | Freq: Once | INTRAMUSCULAR | Status: AC
  Administered 2023-11-14: 125 mg via INTRAVENOUS
  Filled 2023-11-14: qty 2

## 2023-11-14 MED ORDER — IPRATROPIUM-ALBUTEROL 0.5-2.5 (3) MG/3ML IN SOLN
3.0000 mL | Freq: Once | RESPIRATORY_TRACT | Status: AC
  Administered 2023-11-14: 3 mL via RESPIRATORY_TRACT
  Filled 2023-11-14: qty 3

## 2023-11-14 NOTE — ED Provider Notes (Signed)
 [place] EMERGENCY DEPARTMENT AT [facility] Provider Note   CSN: [id number] Arrival date & time: 11/14/23  2228     Patient presents with: Shortness of Breath   Brittany Mcmahon is a 23 y.o. female history of asthma presented to ED with complaint of shortness of breath.  Patient reports feeling short of breath with pleuritic central chest pain for 2 days.  EMS reports patient was 72% on room air initially, and they gave her a DuoNeb and route to the hospital, with improvement of her O2 saturation.  Patient reports that her albuterol  inhaler was expired at home so she was not using it.  She reports she has substernal pleuritic chest pain  She does have a Mirena  IUD x 3 years.  She does not have any personal history of DVT or PE but has a family history in her mother.  {Add pertinent medical, surgical, social history, OB history to HPI:32947} HPI     Prior to Admission medications   Medication Sig Start Date End Date Taking? Authorizing Provider  acetaminophen  (TYLENOL ) 500 MG tablet Take 1,000 mg by mouth 2 (two) times daily as needed for fever or headache (pain).    [provider]  albuterol  (VENTOLIN  HFA) 108 (90 Base) MCG/ACT inhaler Inhale 1-2 puffs into the lungs every 6 (six) hours as needed for wheezing or shortness of breath. 04/06/21   Gabriella Arthor GAILS, MD  cefdinir  (OMNICEF ) 300 MG capsule Take 1 capsule (300 mg total) by mouth 2 (two) times daily. 10/24/23   Renda Glance, MD  levonorgestrel  (MIRENA ) 20 MCG/DAY IUD 1 Intra Uterine Device by Intrauterine route once.    [provider]  naproxen  (NAPROSYN ) 500 MG tablet Take 1 tablet (500 mg total) by mouth 2 (two) times daily. Patient taking differently: Take 500 mg by mouth 2 (two) times daily as needed (pain). 08/30/23   Minnie Tinnie BRAVO, PA  traMADol  (ULTRAM ) 50 MG tablet Take 1-2 tablets (50-100 mg total) by mouth every 6 (six) hours as needed (pain). 10/24/23   Renda Glance, MD    Allergies:  Patient has no known allergies.    Review of Systems  Updated Vital Signs BP (!) 151/107 (BP Location: Left Arm)   Pulse (!) 125   Temp 97.8 F (36.6 C) (Oral)   Resp 20   SpO2 98%   Physical Exam Constitutional:      General: She is not in acute distress.    Appearance: She is obese.  HENT:     Head: Normocephalic and atraumatic.  Eyes:     Conjunctiva/sclera: Conjunctivae normal.     Pupils: Pupils are equal, round, and reactive to light.  Cardiovascular:     Rate and Rhythm: Regular rhythm. Tachycardia present.  Pulmonary:     Effort: Pulmonary effort is normal. No respiratory distress.     Comments: 95% on room air, raspy voice but speaking full sentences Faint expiratory wheezing Abdominal:     General: There is no distension.     Tenderness: There is no abdominal tenderness.  Skin:    General: Skin is warm and dry.  Neurological:     General: No focal deficit present.     Mental Status: She is alert. Mental status is at baseline.  Psychiatric:        Mood and Affect: Mood normal.        Behavior: Behavior normal.     (all labs ordered are listed, but only abnormal results are displayed) Labs  Reviewed  BASIC METABOLIC PANEL WITH GFR  CBC  D-DIMER, QUANTITATIVE    EKG: None  Radiology: No results found.  {Document cardiac monitor, telemetry assessment procedure when appropriate:32947} Procedures   Medications Ordered in the ED  ketorolac  (TORADOL ) 30 MG/ML injection 30 mg (has no administration in time range)  methylPREDNISolone sodium succinate (SOLU-MEDROL) 125 mg/2 mL injection 125 mg (has no administration in time range)  ipratropium-albuterol  (DUONEB) 0.5-2.5 (3) MG/3ML nebulizer solution 3 mL (has no administration in time range)  magnesium sulfate IVPB 2 g 50 mL (has no administration in time range)      {Click here for ABCD2, HEART and other calculators REFRESH Note before signing:1}                              Medical Decision  Making Amount and/or Complexity of Data Reviewed Labs: ordered. Radiology: ordered. ECG/medicine tests: ordered.  Risk Prescription drug management.   Patient is here with shortness of breath.  Differential would include asthma exacerbation versus pneumonia versus viral URI versus PE versus anemia versus other  Risk factors include preceding history of asthma.  Supplemental history provided by EMS.  Patient be given IV steroids, IV magnesium, DuoNeb treatment and Toradol  for shortness of breath, asthma exacerbation and chest pain  Chest pain may be related to bronchitis versus underlying pneumonia.  Lower suspicion for pericarditis or myocarditis, no fever or infectious type etiology on presentation.  Patient does have risk factors for PE including estrogen containing IUD and family history.  We discussed D-dimer and further workup for potential PE.  {Document critical care time when appropriate  Document review of labs and clinical decision tools ie CHADS2VASC2, etc  Document your independent review of radiology images and any outside records  Document your discussion with family members, caretakers and with consultants  Document social determinants of health affecting pt's care  Document your decision making why or why not admission, treatments were needed:32947:::1}   Final diagnoses:  None    ED Discharge Orders     None

## 2023-11-14 NOTE — ED Triage Notes (Signed)
 Pt presents via EMS c/o SOB. EMS reports pt has hx of asthma and did not use inhaler due to being expired EMS report 72% on room air.   EMS gave PTA Duoneb x1, Albuteral x1, Atrovent  x1,   02 improved to 90s on room air after treatment.

## 2023-11-14 NOTE — ED Provider Notes (Signed)
  Provider Note MRN:  [id number]  Arrival date & time: 11/15/23    ED Course and Medical Decision Making  Assumed care of patient at sign-out or upon transfer.  Favoring asthma but also having pleuritic type chest pain and so PE is also considered, felt to be low risk, D-dimer pending.  4 AM update: Patient resting comfortably on reassessment, heart rate in the 90s, PE study negative.  Patient wants to go home, no indication for further testing or admission, appropriate for discharge.  Procedures  Final Clinical Impressions(s) / ED Diagnoses     ICD-10-CM   1. Exacerbation of asthma, unspecified asthma severity, unspecified whether persistent  J45.901       ED Discharge Orders          Ordered    albuterol  (VENTOLIN  HFA) 108 (90 Base) MCG/ACT inhaler  Every 4 hours PRN        11/15/23 0421    predniSONE (DELTASONE) 20 MG tablet  Daily        11/15/23 0421              Discharge Instructions      You were evaluated in the Emergency Department and after careful evaluation, we did not find any emergent condition requiring admission or further testing in the hospital.  Your exam/testing today is overall reassuring.  Symptoms likely due to a flare of your asthma.  Use the inhaler as needed every 4 hours.  Use the prednisone steroid medication as we discussed, follow-up with your primary care doctor.  Please return to the Emergency Department if you experience any worsening of your condition.   Thank you for allowing us  to be a part of your care.      Ozell HERO. Theadore, MD [facility] Emergency [facility] [email or web address]    Theadore Ozell HERO, MD 11/15/23 [phone]

## 2023-11-15 ENCOUNTER — Encounter (HOSPITAL_COMMUNITY): Payer: Self-pay

## 2023-11-15 ENCOUNTER — Emergency Department (HOSPITAL_COMMUNITY)

## 2023-11-15 LAB — CBC
HCT: 39.1 % (ref 36.0–46.0)
Hemoglobin: 12.4 g/dL (ref 12.0–15.0)
MCH: 27.5 pg (ref 26.0–34.0)
MCHC: 31.7 g/dL (ref 30.0–36.0)
MCV: 86.7 fL (ref 80.0–100.0)
Platelets: 405 K/uL — ABNORMAL HIGH (ref 150–400)
RBC: 4.51 MIL/uL (ref 3.87–5.11)
RDW: 14.1 % (ref 11.5–15.5)
WBC: 12.5 K/uL — ABNORMAL HIGH (ref 4.0–10.5)
nRBC: 0 % (ref 0.0–0.2)

## 2023-11-15 LAB — D-DIMER, QUANTITATIVE: D-Dimer, Quant: 0.64 ug{FEU}/mL — ABNORMAL HIGH (ref 0.00–0.50)

## 2023-11-15 LAB — BASIC METABOLIC PANEL WITH GFR
Anion gap: 13 (ref 5–15)
BUN: 11 mg/dL (ref 6–20)
CO2: 24 mmol/L (ref 22–32)
Calcium: 9.5 mg/dL (ref 8.9–10.3)
Chloride: 100 mmol/L (ref 98–111)
Creatinine, Ser: 0.64 mg/dL (ref 0.44–1.00)
GFR, Estimated: >60 mL/min (ref >60)
Glucose, Bld: 81 mg/dL (ref 70–99)
Potassium: 3.6 mmol/L (ref 3.5–5.1)
Sodium: 136 mmol/L (ref 135–145)

## 2023-11-15 MED ORDER — PREDNISONE 20 MG PO TABS: 40.0000 mg | ORAL_TABLET | Freq: Every day | ORAL | 0 refills | Status: AC

## 2023-11-15 MED ORDER — ALBUTEROL SULFATE HFA 108 (90 BASE) MCG/ACT IN AERS: 2.0000 | INHALATION_SPRAY | RESPIRATORY_TRACT | 1 refills | Status: AC | PRN

## 2023-11-15 MED ORDER — IOHEXOL 350 MG/ML SOLN
100.0000 mL | Freq: Once | INTRAVENOUS | Status: AC | PRN
  Administered 2023-11-15: 100 mL via INTRAVENOUS

## 2023-11-15 NOTE — Discharge Instructions (Signed)
 You were evaluated in the Emergency Department and after careful evaluation, we did not find any emergent condition requiring admission or further testing in the hospital.  Your exam/testing today is overall reassuring.  Symptoms likely due to a flare of your asthma.  Use the inhaler as needed every 4 hours.  Use the prednisone steroid medication as we discussed, follow-up with your primary care doctor.  Please return to the Emergency Department if you experience any worsening of your condition.   Thank you for allowing us  to be a part of your care.

## 2023-12-17 ENCOUNTER — Emergency Department (HOSPITAL_COMMUNITY)

## 2023-12-17 ENCOUNTER — Emergency Department (HOSPITAL_COMMUNITY)
Admission: EM | Admit: 2023-12-17 | Discharge: 2023-12-18 | Disposition: A | Discharge status: Home / Self Care | Attending: Emergency Medicine | Admitting: Emergency Medicine

## 2023-12-17 ENCOUNTER — Other Ambulatory Visit: Payer: Self-pay

## 2023-12-17 ENCOUNTER — Encounter (HOSPITAL_COMMUNITY): Payer: Self-pay

## 2023-12-17 DIAGNOSIS — Z5321 Procedure and treatment not carried out due to patient leaving prior to being seen by health care provider: Secondary | ICD-10-CM | POA: Diagnosis not present

## 2023-12-17 DIAGNOSIS — R0602 Shortness of breath: Secondary | ICD-10-CM | POA: Diagnosis present

## 2023-12-17 DIAGNOSIS — R000 Tachycardia, unspecified: Secondary | ICD-10-CM | POA: Diagnosis not present

## 2023-12-17 DIAGNOSIS — J101 Influenza due to other identified influenza virus with other respiratory manifestations: Secondary | ICD-10-CM | POA: Diagnosis not present

## 2023-12-17 DIAGNOSIS — R079 Chest pain, unspecified: Secondary | ICD-10-CM | POA: Diagnosis not present

## 2023-12-17 DIAGNOSIS — J45909 Unspecified asthma, uncomplicated: Secondary | ICD-10-CM | POA: Insufficient documentation

## 2023-12-17 DIAGNOSIS — R059 Cough, unspecified: Secondary | ICD-10-CM | POA: Diagnosis not present

## 2023-12-17 LAB — CBC
HCT: 42.3 % (ref 36.0–46.0)
Hemoglobin: 13.7 g/dL (ref 12.0–15.0)
MCH: 28 pg (ref 26.0–34.0)
MCHC: 32.4 g/dL (ref 30.0–36.0)
MCV: 86.3 fL (ref 80.0–100.0)
Platelets: 439 K/uL — ABNORMAL HIGH (ref 150–400)
RBC: 4.9 MIL/uL (ref 3.87–5.11)
RDW: 14 % (ref 11.5–15.5)
WBC: 13.4 K/uL — ABNORMAL HIGH (ref 4.0–10.5)
nRBC: 0 % (ref 0.0–0.2)

## 2023-12-17 LAB — TROPONIN T, HIGH SENSITIVITY: Troponin T High Sensitivity: <15 ng/L (ref 0–19)

## 2023-12-17 LAB — BASIC METABOLIC PANEL WITH GFR
Anion gap: 14 (ref 5–15)
BUN: 9 mg/dL (ref 6–20)
CO2: 23 mmol/L (ref 22–32)
Calcium: 10 mg/dL (ref 8.9–10.3)
Chloride: 100 mmol/L (ref 98–111)
Creatinine, Ser: 0.72 mg/dL (ref 0.44–1.00)
GFR, Estimated: >60 mL/min (ref >60)
Glucose, Bld: 88 mg/dL (ref 70–99)
Potassium: 4 mmol/L (ref 3.5–5.1)
Sodium: 137 mmol/L (ref 135–145)

## 2023-12-17 LAB — RESP PANEL BY RT-PCR (RSV, FLU A&B, COVID)  RVPGX2
Influenza A by PCR: POSITIVE — AB
Influenza B by PCR: NEGATIVE
Resp Syncytial Virus by PCR: NEGATIVE
SARS Coronavirus 2 by RT PCR: NEGATIVE

## 2023-12-17 LAB — HCG, SERUM, QUALITATIVE: Preg, Serum: NEGATIVE

## 2023-12-17 LAB — I-STAT CG4 LACTIC ACID, ED: Lactic Acid, Venous: 1.4 mmol/L (ref 0.5–1.9)

## 2023-12-17 MED ORDER — ACETAMINOPHEN 500 MG PO TABS
1000.0000 mg | ORAL_TABLET | Freq: Once | ORAL | Status: AC
  Administered 2023-12-17: 1000 mg via ORAL
  Filled 2023-12-17: qty 2

## 2023-12-17 MED ORDER — LACTATED RINGERS IV BOLUS
1000.0000 mL | Freq: Once | INTRAVENOUS | Status: AC
  Administered 2023-12-17: 1000 mL via INTRAVENOUS

## 2023-12-17 MED ORDER — IPRATROPIUM-ALBUTEROL 0.5-2.5 (3) MG/3ML IN SOLN
3.0000 mL | Freq: Once | RESPIRATORY_TRACT | Status: AC
  Administered 2023-12-17: 3 mL via RESPIRATORY_TRACT
  Filled 2023-12-17: qty 3

## 2023-12-17 MED ORDER — IBUPROFEN 200 MG PO TABS
600.0000 mg | ORAL_TABLET | Freq: Once | ORAL | Status: AC
  Administered 2023-12-17: 600 mg via ORAL
  Filled 2023-12-17: qty 3

## 2023-12-17 MED ORDER — IOHEXOL 350 MG/ML SOLN
75.0000 mL | Freq: Once | INTRAVENOUS | Status: AC | PRN
  Administered 2023-12-18: 75 mL via INTRAVENOUS

## 2023-12-17 MED ORDER — METHYLPREDNISOLONE SODIUM SUCC 125 MG IJ SOLR
125.0000 mg | Freq: Once | INTRAMUSCULAR | Status: AC
  Administered 2023-12-17: 125 mg via INTRAVENOUS
  Filled 2023-12-17: qty 2

## 2023-12-17 NOTE — ED Triage Notes (Signed)
 Pt reports shortness of breath, onset last night but worsened today associated with chest pain and productive cough of red stuff today. HR in triage is fluctuating between 116-144.

## 2023-12-17 NOTE — ED Notes (Signed)
 Patient transported to X-ray

## 2023-12-17 NOTE — ED Notes (Signed)
 Blue top sent with other labs down to the main lab

## 2023-12-17 NOTE — ED Provider Notes (Signed)
 [place] EMERGENCY DEPARTMENT AT [facility] Provider Note   CSN: [id number] Arrival date & time: 12/17/23  2025     Patient presents with: Shortness of Breath   Brittany Mcmahon is a 23 y.o. female.   HPI 23 year old female with a history of asthma presents with cough and shortness of breath.  Symptoms started yesterday but got worse today.  This is similar to when she was here last month.  Patient states she has had a cough with some occasional sputum.  Today at work when she was coughing really hard she noticed some spots of blood in her sputum.  This has happened before as well.  She states that sometimes she is coughing so hard that she will actually vomit.  She has not had a fever to her knowledge (though temp was 100.2 in triage).  She has been using her albuterol  inhaler but does not feel like it is helping and has caused her to get lightheaded.  She has chest pain with coughing only but feels short of breath.  No abdominal pain.  Prior to Admission medications   Medication Sig Start Date End Date Taking? Authorizing Provider  acetaminophen  (TYLENOL ) 500 MG tablet Take 1,000 mg by mouth 2 (two) times daily as needed for fever or headache (pain).    [provider]  albuterol  (VENTOLIN  HFA) 108 (90 Base) MCG/ACT inhaler Inhale 2 puffs into the lungs every 4 (four) hours as needed for wheezing or shortness of breath. 11/15/23   Theadore Ozell HERO, MD  cefdinir  (OMNICEF ) 300 MG capsule Take 1 capsule (300 mg total) by mouth 2 (two) times daily. 10/24/23   Renda Glance, MD  levonorgestrel  (MIRENA ) 20 MCG/DAY IUD 1 Intra Uterine Device by Intrauterine route once.    [provider]  naproxen  (NAPROSYN ) 500 MG tablet Take 1 tablet (500 mg total) by mouth 2 (two) times daily. Patient taking differently: Take 500 mg by mouth 2 (two) times daily as needed (pain). 08/30/23   Minnie Tinnie BRAVO, PA  traMADol  (ULTRAM ) 50 MG tablet Take 1-2 tablets (50-100 mg total) by  mouth every 6 (six) hours as needed (pain). 10/24/23   Renda Glance, MD    Allergies: Patient has no known allergies.    Review of Systems  Constitutional:  Negative for fever.  Respiratory:  Positive for cough, shortness of breath and wheezing.   Cardiovascular:  Positive for chest pain (with coughing).  Gastrointestinal:  Negative for abdominal pain.    Updated Vital Signs BP 98/72   Pulse (!) 117   Temp 100.2 F (37.9 C) (Oral)   Resp 17   Ht 5' 5 (1.651 m)   Wt 131.5 kg   LMP 12/01/2023 (Approximate)   SpO2 100%   BMI 48.26 kg/m   Physical Exam Vitals and nursing note reviewed.  Constitutional:      Appearance: She is well-developed. She is obese. She is not diaphoretic.  HENT:     Head: Normocephalic and atraumatic.  Cardiovascular:     Rate and Rhythm: Regular rhythm. Tachycardia present.     Heart sounds: Normal heart sounds.  Pulmonary:     Effort: Pulmonary effort is normal. Tachypnea present. No accessory muscle usage or respiratory distress.     Breath sounds: Decreased breath sounds present.  Abdominal:     Palpations: Abdomen is soft.     Tenderness: There is no abdominal tenderness.  Skin:    General: Skin is warm and dry.  Neurological:  Mental Status: She is alert.     (all labs ordered are listed, but only abnormal results are displayed) Labs Reviewed  RESP PANEL BY RT-PCR (RSV, FLU A&B, COVID)  RVPGX2 - Abnormal; Notable for the following components:      Result Value   Influenza A by PCR POSITIVE (*)    All other components within normal limits  CBC - Abnormal; Notable for the following components:   WBC 13.4 (*)    Platelets 439 (*)    All other components within normal limits  BASIC METABOLIC PANEL WITH GFR  HCG, SERUM, QUALITATIVE  I-STAT CG4 LACTIC ACID, ED  TROPONIN T, HIGH SENSITIVITY    EKG: EKG Interpretation Date/Time:  Saturday December 17 2023 20:33:30 EST Ventricular Rate:  123 PR Interval:  133 QRS  Duration:  90 QT Interval:  300 QTC Calculation: 430 R Axis:   71  Text Interpretation: Sinus tachycardia Ventricular premature complex Baseline wander in lead(s) I II aVR aVL aVF V1 V2 V3 V4 V5 V6 similar to Oct 2025 Confirmed by Freddi Hamilton [phone]) on 12/17/2023 10:38:48 PM  Radiology: DG Chest 2 View Result Date: 12/17/2023 EXAM: 2 VIEW(S) XRAY OF THE CHEST 12/17/2023 09:03:00 PM COMPARISON: None available. CLINICAL HISTORY: cp/sob cp/sob FINDINGS: LUNGS AND PLEURA: No focal pulmonary opacity. No pleural effusion. No pneumothorax. HEART AND MEDIASTINUM: No acute abnormality of the cardiac and mediastinal silhouettes. BONES AND SOFT TISSUES: No acute osseous abnormality. IMPRESSION: 1. No acute process. Electronically signed by: Dorethia Molt MD 12/17/2023 09:09 PM EST RP Workstation: [id number]     Procedures   Medications Ordered in the ED  lactated ringers  bolus 1,000 mL (has no administration in time range)  acetaminophen  (TYLENOL ) tablet 1,000 mg (1,000 mg Oral Given 12/17/23 2134)  lactated ringers  bolus 1,000 mL (1,000 mLs Intravenous Bolus from Bag 12/17/23 2133)  methylPREDNISolone sodium succinate (SOLU-MEDROL) 125 mg/2 mL injection 125 mg (125 mg Intravenous Given 12/17/23 2139)  ipratropium-albuterol  (DUONEB) 0.5-2.5 (3) MG/3ML nebulizer solution 3 mL (3 mLs Nebulization Given 12/17/23 2137)  ipratropium-albuterol  (DUONEB) 0.5-2.5 (3) MG/3ML nebulizer solution 3 mL (3 mLs Nebulization Given 12/17/23 2250)                                    Medical Decision Making Amount and/or Complexity of Data Reviewed External Data Reviewed: notes. Labs: ordered.    Details: Flu A+, leukocytosis Radiology: ordered and independent interpretation performed.    Details: No pneumonia ECG/medicine tests: ordered and independent interpretation performed.    Details: Sinus tachycardia  Risk OTC drugs. Prescription drug management.   Patient presents with acute shortness of  breath.  She was given some DuoNeb with some minimal improvement, still remains quite tachycardic.  She has had some soft blood pressures and she was reporting some coughing up blood so a CT will be obtained.  Does seem similar to last ED visit though at the same time she did not have low-grade fever.  Her flu test is positive, though I do not think this would cause some soft blood pressures and so I think continue with CTA is reasonable. Care transferred to Dr. Griselda.     Final diagnoses:  None    ED Discharge Orders     None          Freddi Hamilton, MD 12/17/23 2333

## 2023-12-17 NOTE — ED Notes (Signed)
 Patient transported to CT

## 2023-12-18 ENCOUNTER — Emergency Department (HOSPITAL_BASED_OUTPATIENT_CLINIC_OR_DEPARTMENT_OTHER): Admitting: Radiology

## 2023-12-18 ENCOUNTER — Emergency Department (HOSPITAL_BASED_OUTPATIENT_CLINIC_OR_DEPARTMENT_OTHER)
Admission: EM | Admit: 2023-12-18 | Discharge: 2023-12-18 | Discharge status: Against Medical Advice | Source: Home / Self Care

## 2023-12-18 ENCOUNTER — Encounter (HOSPITAL_BASED_OUTPATIENT_CLINIC_OR_DEPARTMENT_OTHER): Payer: Self-pay

## 2023-12-18 DIAGNOSIS — R0602 Shortness of breath: Secondary | ICD-10-CM | POA: Insufficient documentation

## 2023-12-18 DIAGNOSIS — Z5321 Procedure and treatment not carried out due to patient leaving prior to being seen by health care provider: Secondary | ICD-10-CM | POA: Insufficient documentation

## 2023-12-18 DIAGNOSIS — R059 Cough, unspecified: Secondary | ICD-10-CM | POA: Insufficient documentation

## 2023-12-18 DIAGNOSIS — R079 Chest pain, unspecified: Secondary | ICD-10-CM | POA: Insufficient documentation

## 2023-12-18 LAB — COMPREHENSIVE METABOLIC PANEL WITH GFR
ALT: 10 U/L (ref 0–44)
AST: 25 U/L (ref 15–41)
Albumin: 4.2 g/dL (ref 3.5–5.0)
Alkaline Phosphatase: 80 U/L (ref 38–126)
Anion gap: 14 (ref 5–15)
BUN: 12 mg/dL (ref 6–20)
CO2: 22 mmol/L (ref 22–32)
Calcium: 10.5 mg/dL — ABNORMAL HIGH (ref 8.9–10.3)
Chloride: 101 mmol/L (ref 98–111)
Creatinine, Ser: 0.66 mg/dL (ref 0.44–1.00)
GFR, Estimated: >60 mL/min (ref >60)
Glucose, Bld: 109 mg/dL — ABNORMAL HIGH (ref 70–99)
Potassium: 4.1 mmol/L (ref 3.5–5.1)
Sodium: 137 mmol/L (ref 135–145)
Total Bilirubin: 0.4 mg/dL (ref 0.0–1.2)
Total Protein: 9.2 g/dL — ABNORMAL HIGH (ref 6.5–8.1)

## 2023-12-18 LAB — CBC
HCT: 41 % (ref 36.0–46.0)
Hemoglobin: 13.5 g/dL (ref 12.0–15.0)
MCH: 27.8 pg (ref 26.0–34.0)
MCHC: 32.9 g/dL (ref 30.0–36.0)
MCV: 84.5 fL (ref 80.0–100.0)
Platelets: 414 K/uL — ABNORMAL HIGH (ref 150–400)
RBC: 4.85 MIL/uL (ref 3.87–5.11)
RDW: 14.2 % (ref 11.5–15.5)
WBC: 13.9 K/uL — ABNORMAL HIGH (ref 4.0–10.5)
nRBC: 0 % (ref 0.0–0.2)

## 2023-12-18 LAB — PREGNANCY, URINE: Preg Test, Ur: NEGATIVE

## 2023-12-18 LAB — LIPASE, BLOOD: Lipase: 16 U/L (ref 11–51)

## 2023-12-18 MED ORDER — ALBUTEROL SULFATE (2.5 MG/3ML) 0.083% IN NEBU
INHALATION_SOLUTION | RESPIRATORY_TRACT | Status: AC
  Administered 2023-12-18: 2.5 mg
  Filled 2023-12-18: qty 3

## 2023-12-18 MED ORDER — PREDNISONE 10 MG PO TABS: 40.0000 mg | ORAL_TABLET | Freq: Every day | ORAL | 0 refills | Status: AC

## 2023-12-18 MED ORDER — BENZONATATE 100 MG PO CAPS
200.0000 mg | ORAL_CAPSULE | Freq: Once | ORAL | Status: AC
  Administered 2023-12-18: 200 mg via ORAL
  Filled 2023-12-18: qty 2

## 2023-12-18 MED ORDER — OSELTAMIVIR PHOSPHATE 75 MG PO CAPS: 75.0000 mg | ORAL_CAPSULE | Freq: Two times a day (BID) | ORAL | 0 refills | Status: AC

## 2023-12-18 MED ORDER — ONDANSETRON HCL 4 MG/2ML IJ SOLN
4.0000 mg | Freq: Once | INTRAMUSCULAR | Status: AC | PRN
  Administered 2023-12-18: 4 mg via INTRAVENOUS
  Filled 2023-12-18: qty 2

## 2023-12-18 MED ORDER — GUAIFENESIN 100 MG/5ML PO LIQD
5.0000 mL | Freq: Once | ORAL | Status: AC
  Administered 2023-12-18: 5 mL via ORAL
  Filled 2023-12-18: qty 10

## 2023-12-18 MED ORDER — ALBUTEROL SULFATE HFA 108 (90 BASE) MCG/ACT IN AERS: 1.0000 | INHALATION_SPRAY | Freq: Four times a day (QID) | RESPIRATORY_TRACT | 1 refills | Status: AC | PRN

## 2023-12-18 MED ORDER — ALBUTEROL SULFATE (2.5 MG/3ML) 0.083% IN NEBU: 2.5000 mg | INHALATION_SOLUTION | Freq: Once | RESPIRATORY_TRACT | Status: DC

## 2023-12-18 MED ORDER — BENZONATATE 100 MG PO CAPS: 100.0000 mg | ORAL_CAPSULE | Freq: Three times a day (TID) | ORAL | 0 refills | Status: AC

## 2023-12-18 NOTE — ED Provider Notes (Signed)
 Care assumed at 2300.  Patient with history of asthma here for evaluation of shortness of breath and cough.  She is positive for influenza A.  Care assumed pending CTA.  CTA is negative for acute process.  She does have some tachycardia in the emergency department but no respiratory distress.  She has good air movement bilaterally with no expiratory wheezes.  Discussed with patient findings of influenza.  Will also treat for asthma exacerbation.  Given her comorbidities we will start on Tamiflu .  Current picture is not consistent with myocarditis, CHF, acute respiratory failure.  Discussed home care for influenza.  Discussed return precautions for progressive or new concerning symptoms.   Griselda Norris, MD 12/18/23 [phone]

## 2023-12-18 NOTE — ED Triage Notes (Signed)
 Patient arrives via EMS from home with complaints of shortness of breath and chest pain from coughing. Patient was seen at another ED yesterday and diagnosed with Flu A.  EMS vitals: 98% RA 98.7 F 107 HR 144 systolic.

## 2024-01-21 ENCOUNTER — Emergency Department (HOSPITAL_COMMUNITY)
Admission: EM | Admit: 2024-01-21 | Discharge: 2024-01-21 | Disposition: A | Discharge status: Home / Self Care | Attending: Emergency Medicine | Admitting: Emergency Medicine

## 2024-01-21 ENCOUNTER — Other Ambulatory Visit: Payer: Self-pay

## 2024-01-21 DIAGNOSIS — J45909 Unspecified asthma, uncomplicated: Secondary | ICD-10-CM | POA: Insufficient documentation

## 2024-01-21 DIAGNOSIS — B9789 Other viral agents as the cause of diseases classified elsewhere: Secondary | ICD-10-CM | POA: Insufficient documentation

## 2024-01-21 DIAGNOSIS — J069 Acute upper respiratory infection, unspecified: Secondary | ICD-10-CM | POA: Diagnosis not present

## 2024-01-21 DIAGNOSIS — R059 Cough, unspecified: Secondary | ICD-10-CM | POA: Diagnosis present

## 2024-01-21 LAB — RESP PANEL BY RT-PCR (RSV, FLU A&B, COVID)  RVPGX2
Influenza A by PCR: NEGATIVE
Influenza B by PCR: NEGATIVE
Resp Syncytial Virus by PCR: NEGATIVE
SARS Coronavirus 2 by RT PCR: NEGATIVE

## 2024-01-21 LAB — POC URINE PREG, ED: Preg Test, Ur: NEGATIVE

## 2024-01-21 MED ORDER — DEXAMETHASONE 1 MG/ML PO CONC
10.0000 mg | Freq: Once | ORAL | Status: AC
  Administered 2024-01-21: 10 mg via ORAL
  Filled 2024-01-21: qty 10

## 2024-01-21 NOTE — ED Triage Notes (Signed)
 Pt has cough, congestion, scratchy throat- also wants a pregnancy test due to being a week late

## 2024-01-21 NOTE — ED Provider Notes (Signed)
 " [place] EMERGENCY DEPARTMENT AT [facility] Provider Note   CSN: [id number] Arrival date & time: 01/21/24  1658     Patient presents with: Cough   Brittany Mcmahon is a 23 y.o. female.  Past medical history significant for asthma presents today for cough, congestion, and sore throat.  Patient is also concerned about pregnancy since she is a week late for her menses.  Patient denies fever, chills, nausea, vomiting, chest pain, shortness of breath, any other complaints at this time.    Cough Associated symptoms: sore throat        Prior to Admission medications  Medication Sig Start Date End Date Taking? Authorizing Provider  acetaminophen  (TYLENOL ) 500 MG tablet Take 1,000 mg by mouth 2 (two) times daily as needed for fever or headache (pain).    [provider]  albuterol  (VENTOLIN  HFA) 108 (90 Base) MCG/ACT inhaler Inhale 2 puffs into the lungs every 4 (four) hours as needed for wheezing or shortness of breath. 11/15/23   Theadore Ozell HERO, MD  albuterol  (VENTOLIN  HFA) 108 (90 Base) MCG/ACT inhaler Inhale 1-2 puffs into the lungs every 6 (six) hours as needed for wheezing or shortness of breath. 12/18/23   Griselda Norris, MD  benzonatate  (TESSALON ) 100 MG capsule Take 1 capsule (100 mg total) by mouth every 8 (eight) hours. 12/18/23   Griselda Norris, MD  cefdinir  (OMNICEF ) 300 MG capsule Take 1 capsule (300 mg total) by mouth 2 (two) times daily. 10/24/23   Renda Glance, MD  levonorgestrel  (MIRENA ) 20 MCG/DAY IUD 1 Intra Uterine Device by Intrauterine route once.    [provider]  naproxen  (NAPROSYN ) 500 MG tablet Take 1 tablet (500 mg total) by mouth 2 (two) times daily. Patient taking differently: Take 500 mg by mouth 2 (two) times daily as needed (pain). 08/30/23   Minnie Tinnie BRAVO, PA  oseltamivir  (TAMIFLU ) 75 MG capsule Take 1 capsule (75 mg total) by mouth every 12 (twelve) hours. 12/18/23   Griselda Norris, MD  predniSONE  (DELTASONE ) 10 MG  tablet Take 4 tablets (40 mg total) by mouth daily. 12/18/23   Griselda Norris, MD  traMADol  (ULTRAM ) 50 MG tablet Take 1-2 tablets (50-100 mg total) by mouth every 6 (six) hours as needed (pain). 10/24/23   Renda Glance, MD    Allergies: Patient has no known allergies.    Review of Systems  HENT:  Positive for congestion and sore throat.   Respiratory:  Positive for cough.     Updated Vital Signs BP (!) 159/115 (BP Location: Left Arm)   Pulse 85   Temp 98.4 F (36.9 C) (Oral)   Resp 18   SpO2 100%   Physical Exam Vitals and nursing note reviewed.  Constitutional:      General: She is not in acute distress.    Appearance: Normal appearance. She is well-developed. She is not toxic-appearing.  HENT:     Head: Normocephalic and atraumatic.     Right Ear: External ear normal.     Left Ear: External ear normal.     Nose: Congestion and rhinorrhea present.     Mouth/Throat:     Mouth: Mucous membranes are moist.     Pharynx: Posterior oropharyngeal erythema present. No oropharyngeal exudate.  Eyes:     Conjunctiva/sclera: Conjunctivae normal.  Cardiovascular:     Rate and Rhythm: Normal rate and regular rhythm.     Pulses: Normal pulses.     Heart sounds: Normal heart sounds. No murmur heard.  Pulmonary:     Effort: Pulmonary effort is normal. No respiratory distress.     Breath sounds: Normal breath sounds.  Abdominal:     Palpations: Abdomen is soft.     Tenderness: There is no abdominal tenderness.  Musculoskeletal:        General: No swelling.     Cervical back: Neck supple.  Skin:    General: Skin is warm and dry.     Capillary Refill: Capillary refill takes less than 2 seconds.  Neurological:     General: No focal deficit present.     Mental Status: She is alert and oriented to person, place, and time.  Psychiatric:        Mood and Affect: Mood normal.     (all labs ordered are listed, but only abnormal results are displayed) Labs Reviewed  RESP PANEL BY  RT-PCR (RSV, FLU A&B, COVID)  [id number]  POC URINE PREG, ED    EKG: None  Radiology: No results found.   Procedures   Medications Ordered in the ED  dexamethasone  (DECADRON ) 1 MG/ML solution 10 mg (has no administration in time range)                                    Medical Decision Making Amount and/or Complexity of Data Reviewed Labs: ordered.  Risk Prescription drug management.   This patient presents to the ED for concern of late menses, URI symptoms differential diagnosis includes COVID, flu, RSV, viral URI, pregnancy    Additional history obtained   Additional history obtained from Electronic Medical Record External records from outside source obtained and reviewed including urology notes   Lab Tests:  I Ordered, and personally interpreted labs.  The pertinent results include: Negative pregnancy POC, respiratory panel negative  Medicines ordered and prescription drug management:  I ordered medication including Solu-Medrol     I have reviewed the patients home medicines and have made adjustments as needed   Problem List / ED Course:  Considered for admission or further workup however patients vital signs, physical exam, labs, and imaging are reassuring. Patient's symptoms likely due to viral URI.  Patient advised to take Tylenol /Motrin  as needed for fever, Flonase  as needed for nasal congestion, and plain Mucinex  as needed for chest congestion. Patient should follow-up with their primary care in the upcoming week if their symptoms persist for further evaluation and workup.       Final diagnoses:  Viral URI with cough    ED Discharge Orders     None          Brittany Mcmahon 01/21/24 2006    Brittany Elsie CROME, MD 01/21/24 2350  "

## 2024-01-21 NOTE — Discharge Instructions (Signed)
 Today you were seen for a viral URI.  Please alternate Tylenol  and Motrin  for pain and fever.  You may take Flonase  for nasal congestion and Mucinex  for chest congestion.  Thank you for letting us  treat you today. After reviewing your lab, I feel you are safe to go home. Please follow up with your PCP in the next several days and provide them with your records from this visit. Return to the Emergency Room if pain becomes severe or symptoms worsen.
